# Patient Record
Sex: Female | Born: 1975
Health system: Southern US, Community
[De-identification: ages and names within clinical notes are randomized; demographics above are authoritative.]

## PROBLEM LIST (undated history)

## (undated) DIAGNOSIS — F329 Major depressive disorder, single episode, unspecified: Secondary | ICD-10-CM

## (undated) DIAGNOSIS — Z975 Presence of (intrauterine) contraceptive device: Secondary | ICD-10-CM

## (undated) DIAGNOSIS — Z808 Family history of malignant neoplasm of other organs or systems: Secondary | ICD-10-CM

## (undated) DIAGNOSIS — F32A Depression, unspecified: Secondary | ICD-10-CM

## (undated) DIAGNOSIS — B019 Varicella without complication: Secondary | ICD-10-CM

## (undated) DIAGNOSIS — S8265XA Nondisplaced fracture of lateral malleolus of left fibula, initial encounter for closed fracture: Secondary | ICD-10-CM

## (undated) DIAGNOSIS — F419 Anxiety disorder, unspecified: Secondary | ICD-10-CM

## (undated) HISTORY — DX: Depression, unspecified: F32.A

## (undated) HISTORY — DX: Nondisplaced fracture of lateral malleolus of left fibula, initial encounter for closed fracture: S82.65XA

## (undated) HISTORY — DX: Varicella without complication: B01.9

## (undated) HISTORY — DX: Presence of (intrauterine) contraceptive device: Z97.5

## (undated) HISTORY — DX: Major depressive disorder, single episode, unspecified: F32.9

## (undated) HISTORY — DX: Family history of malignant neoplasm of other organs or systems: Z80.8

---

## 2008-04-07 HISTORY — PX: AUGMENTATION MAMMAPLASTY: SUR837

## 2008-04-07 HISTORY — PX: COSMETIC SURGERY: SHX468

## 2013-03-10 DIAGNOSIS — K589 Irritable bowel syndrome without diarrhea: Secondary | ICD-10-CM | POA: Insufficient documentation

## 2017-09-17 ENCOUNTER — Ambulatory Visit: Payer: 59 | Admitting: Family Medicine

## 2017-09-17 ENCOUNTER — Other Ambulatory Visit: Payer: Self-pay

## 2017-09-17 ENCOUNTER — Encounter: Payer: Self-pay | Admitting: Family Medicine

## 2017-09-17 VITALS — BP 118/72 | HR 85 | Temp 98.0°F | Resp 16 | Ht 61.0 in | Wt 144.4 lb

## 2017-09-17 DIAGNOSIS — Z808 Family history of malignant neoplasm of other organs or systems: Secondary | ICD-10-CM | POA: Diagnosis not present

## 2017-09-17 DIAGNOSIS — Z975 Presence of (intrauterine) contraceptive device: Secondary | ICD-10-CM

## 2017-09-17 DIAGNOSIS — Z0000 Encounter for general adult medical examination without abnormal findings: Secondary | ICD-10-CM | POA: Diagnosis not present

## 2017-09-17 HISTORY — DX: Family history of malignant neoplasm of other organs or systems: Z80.8

## 2017-09-17 HISTORY — DX: Presence of (intrauterine) contraceptive device: Z97.5

## 2017-09-17 NOTE — Progress Notes (Signed)
Subjective  Chief Complaint  Patient presents with  . Establish Care    Moved here from [place] a year ago, no CPE in years  . Annual Exam    Wants a PAP next visit    HPI: Alison Tucker is a 42 y.o. female who presents to [facility] today for a Female Wellness Visit.  Very pleasant 42 year old healthy female who most recently lived in [place], originally from [place].  Parents are from [place] and lived there now.  She has 2 daughters.  Her son passed away 3 years ago from brain cancer.  See family history Wellness Visit: annual visit with health maintenance review and exam without Pap.  Last Pap reportedly normal a year ago her gynecologist   Health maintenance: Discussed breast cancer screening.  Patient defers and will rediscuss next year.  She has no health related needs today.  Lives healthy lifestyle.  No chronic health conditions.  She uses an IUD Mirena for birth control and is amenorrheic.  Assessment  1. Encounter for annual health examination   2. IUD (intrauterine device) in place - mirena 2018   3. Family history of brain cancer   4. Family history of brain stem cancer      Plan  Female Wellness Visit:  Age appropriate Health Maintenance and Prevention measures were discussed with patient. Included topics are cancer screening recommendations, ways to keep healthy (see AVS) including dietary and exercise recommendations, regular eye and dental care, use of seat belts, and avoidance of moderate alcohol use and tobacco use.   BMI: discussed patient's BMI and encouraged positive lifestyle modifications to help get to or maintain a target BMI.  HM needs and immunizations were addressed and ordered. See below for orders. See HM and immunization section for updates.  Routine labs and screening tests ordered including cmp, cbc and lipids where appropriate.  Discussed recommendations regarding Vit D and calcium supplementation (see  AVS)  Follow up: No follow-ups on file.   Orders Placed This Encounter  Procedures  . CBC with Differential/Platelet  . Comprehensive metabolic panel  . Lipid panel  . HIV antibody   No orders of the defined types were placed in this encounter.    Lifestyle: Body mass index is 27.28 kg/m. Wt Readings from Last 3 Encounters:  09/17/17 144 lb 6.4 oz (65.5 kg)   Diet: low fat Exercise: frequently, walking and weightlifting  Patient Active Problem List   Diagnosis Date Noted  . IUD (intrauterine device) in place - mirena 2018 09/17/2017  . Family history of brain cancer 09/17/2017    Son, negative genetic screening   . Family history of brain stem cancer 09/17/2017    Sister's daughter; neg genetic testing    Health Maintenance  Topic Date Due  . HIV Screening  11/08/1991  . INFLUENZA VACCINE  11/05/2017  . PAP SMEAR  04/08/2019  . TETANUS/TDAP  04/08/2023    There is no immunization history on file for this patient. We updated and reviewed the patient's past history in detail and it is documented below. Allergies: Patient has No Known Allergies. Past Medical History Patient  has a past medical history of Chicken pox, Depression, Family history of brain cancer (09/17/2017), Family history of brain stem cancer (09/17/2017), and IUD (intrauterine device) in place - mirena 2018 (09/17/2017). Past Surgical History Patient  has a past surgical history that includes Cosmetic surgery (2010). Family History: Patient family history includes Alcohol abuse in her maternal grandfather;  Arthritis in her maternal grandmother and mother; Brain cancer in her other; Brain cancer (age of onset: 51) in her son; Hyperlipidemia in her mother; Hypertension in her mother; Learning disabilities in her daughter; Mental retardation in her daughter; Seizures in her daughter; Stroke in her mother. Social History:  Patient  reports that she has quit smoking. She has never used smokeless tobacco. She  reports that she drinks alcohol. She reports that she does not use drugs.  Review of Systems: Constitutional: negative for fever or malaise Ophthalmic: negative for photophobia, double vision or loss of vision Cardiovascular: negative for chest pain, dyspnea on exertion, or new LE swelling Respiratory: negative for SOB or persistent cough Gastrointestinal: negative for abdominal pain, change in bowel habits or melena Genitourinary: negative for dysuria or gross hematuria, no abnormal uterine bleeding or disharge Musculoskeletal: negative for new gait disturbance or muscular weakness Integumentary: negative for new or persistent rashes, no breast lumps Neurological: negative for TIA or stroke symptoms Psychiatric: negative for SI or delusions Allergic/Immunologic: negative for hives  Patient Care Team    Relationship Specialty Notifications Start End  Willow Ora, MD PCP - General Family Medicine  09/17/17     Objective  Vitals: BP 118/72   Pulse 85   Temp 98 F (36.7 C) (Oral)   Resp 16   Ht 5\' 1"  (1.549 m)   Wt 144 lb 6.4 oz (65.5 kg)   SpO2 99%   BMI 27.28 kg/m  General:  Well developed, well nourished, no acute distress  Psych:  Alert and orientedx3,normal mood and affect HEENT:  Normocephalic, atraumatic, non-icteric sclera, PERRL, oropharynx is clear without mass or exudate, supple neck without adenopathy, mass or thyromegaly Cardiovascular:  Normal S1, S2, RRR without gallop, rub or murmur, nondisplaced PMI Respiratory:  Good breath sounds bilaterally, CTAB with normal respiratory effort Gastrointestinal: normal bowel sounds, soft, non-tender, no noted masses. No HSM MSK: no deformities, contusions. Joints are without erythema or swelling. Spine and CVA region are nontender Skin:  Warm, no rashes or suspicious lesions noted Neurologic:    Mental status is normal. CN 2-11 are normal. Gross motor and sensory exams are normal. Normal gait. No tremor Breast Exam: No mass,  skin retraction or nipple discharge is appreciated in either breast. No axillary adenopathy. Fibrocystic changes are not noted   Commons side effects, risks, benefits, and alternatives for medications and treatment plan prescribed today were discussed, and the patient expressed understanding of the given instructions. Patient is instructed to call or message via MyChart if he/she has any questions or concerns regarding our treatment plan. No barriers to understanding were identified. We discussed Red Flag symptoms and signs in detail. Patient expressed understanding regarding what to do in case of urgent or emergency type symptoms.   Medication list was reconciled, printed and provided to the patient in AVS. Patient instructions and summary information was reviewed with the patient as documented in the AVS. This note was prepared with assistance of Dragon voice recognition software. Occasional wrong-word or sound-a-like substitutions may have occurred due to the inherent limitations of voice recognition software

## 2017-09-17 NOTE — Patient Instructions (Signed)
Please return in 12 months for your annual complete physical; please come fasting.  Please let me know when you had your last Tdap once you find the records.   I will release your lab results to you on your MyChart account with further instructions. Please reply with any questions.    It was a pleasure meeting you today! Thank you for choosing us to meet your healthcare needs! I truly look forward to working with you. If you have any questions or concerns, please send me a message via Mychart or call the office at [phone].  Please do these things to maintain good health!   Exercise at least 30-45 minutes a day,  4-5 days a week.   Eat a low-fat diet with lots of fruits and vegetables, up to 7-9 servings per day.  Drink plenty of water daily. Try to drink 8 8oz glasses per day.  Seatbelts can save your life. Always wear your seatbelt.  Place Smoke Detectors on every level of your home and check batteries every year.  Schedule an appointment with an eye doctor for an eye exam every 1-2 years  Safe sex - use condoms to protect yourself from STDs if you could be exposed to these types of infections. Use birth control if you do not want to become pregnant and are sexually active.  Avoid heavy alcohol use. If you drink, keep it to less than 2 drinks/day and not every day.  Health Care Power of Attorney.  Choose someone you trust that could speak for you if you became unable to speak for yourself.  Depression is common in our stressful world.If you're feeling down or losing interest in things you normally enjoy, please come in for a visit.  If anyone is threatening or hurting you, please get help. Physical or Emotional Violence is never OK.

## 2017-09-18 LAB — COMPREHENSIVE METABOLIC PANEL
AG RATIO: 1.6 (calc) (ref 1.0–2.5)
ALT: 26 U/L (ref 6–29)
AST: 23 U/L (ref 10–30)
Albumin: 4.4 g/dL (ref 3.6–5.1)
Alkaline phosphatase (APISO): 48 U/L (ref 33–115)
BUN: 11 mg/dL (ref 7–25)
CHLORIDE: 105 mmol/L (ref 98–110)
CO2: 27 mmol/L (ref 20–32)
Calcium: 9.3 mg/dL (ref 8.6–10.2)
Creat: 0.6 mg/dL (ref 0.50–1.10)
GLOBULIN: 2.8 g/dL (ref 1.9–3.7)
GLUCOSE: 85 mg/dL (ref 65–99)
Potassium: 4.2 mmol/L (ref 3.5–5.3)
SODIUM: 140 mmol/L (ref 135–146)
TOTAL PROTEIN: 7.2 g/dL (ref 6.1–8.1)
Total Bilirubin: 0.4 mg/dL (ref 0.2–1.2)

## 2017-09-18 LAB — CBC WITH DIFFERENTIAL/PLATELET
BASOS PCT: 0.9 %
Basophils Absolute: 59 cells/uL (ref 0–200)
EOS PCT: 1.2 %
Eosinophils Absolute: 78 cells/uL (ref 15–500)
HCT: 36.5 % (ref 35.0–45.0)
HEMOGLOBIN: 12.7 g/dL (ref 11.7–15.5)
Lymphs Abs: 2028 cells/uL (ref 850–3900)
MCH: 32.5 pg (ref 27.0–33.0)
MCHC: 34.8 g/dL (ref 32.0–36.0)
MCV: 93.4 fL (ref 80.0–100.0)
MONOS PCT: 11 %
MPV: 10.6 fL (ref 7.5–12.5)
NEUTROS ABS: 3621 {cells}/uL (ref 1500–7800)
Neutrophils Relative %: 55.7 %
Platelets: 278 10*3/uL (ref 140–400)
RBC: 3.91 10*6/uL (ref 3.80–5.10)
RDW: 12.9 % (ref 11.0–15.0)
Total Lymphocyte: 31.2 %
WBC mixed population: 715 cells/uL (ref 200–950)
WBC: 6.5 10*3/uL (ref 3.8–10.8)

## 2017-09-18 LAB — HIV ANTIBODY (ROUTINE TESTING W REFLEX): HIV: NONREACTIVE

## 2017-09-18 LAB — LIPID PANEL
Cholesterol: 233 mg/dL — ABNORMAL HIGH (ref <200)
HDL: 49 mg/dL — ABNORMAL LOW (ref >50)
LDL Cholesterol (Calc): 164 mg/dL (calc) — ABNORMAL HIGH
NON-HDL CHOLESTEROL (CALC): 184 mg/dL — AB (ref <130)
TRIGLYCERIDES: 95 mg/dL (ref <150)
Total CHOL/HDL Ratio: 4.8 (calc) (ref <5.0)

## 2017-10-01 ENCOUNTER — Encounter: Payer: Self-pay | Admitting: Family Medicine

## 2017-10-26 ENCOUNTER — Encounter: Payer: Self-pay | Admitting: Family Medicine

## 2017-10-26 NOTE — Telephone Encounter (Signed)
Form placed on your desk.   Kathi SimpersAmy Peterman,  LPN

## 2017-11-05 ENCOUNTER — Encounter: Payer: Self-pay | Admitting: Family Medicine

## 2017-11-09 ENCOUNTER — Ambulatory Visit: Payer: 59 | Admitting: Family Medicine

## 2017-11-09 ENCOUNTER — Other Ambulatory Visit (HOSPITAL_COMMUNITY)
Admission: RE | Admit: 2017-11-09 | Discharge: 2017-11-09 | Disposition: A | Discharge status: Home / Self Care | Payer: 59 | Source: Ambulatory Visit | Attending: Family Medicine | Admitting: Family Medicine

## 2017-11-09 ENCOUNTER — Encounter: Payer: Self-pay | Admitting: Family Medicine

## 2017-11-09 ENCOUNTER — Other Ambulatory Visit: Payer: Self-pay

## 2017-11-09 VITALS — BP 124/80 | HR 91 | Temp 98.3°F | Ht 61.0 in | Wt 144.4 lb

## 2017-11-09 DIAGNOSIS — R102 Pelvic and perineal pain: Secondary | ICD-10-CM | POA: Diagnosis not present

## 2017-11-09 DIAGNOSIS — Z975 Presence of (intrauterine) contraceptive device: Secondary | ICD-10-CM

## 2017-11-09 LAB — CBC WITH DIFFERENTIAL/PLATELET
BASOS PCT: 1 % (ref 0.0–3.0)
Basophils Absolute: 0.1 10*3/uL (ref 0.0–0.1)
Eosinophils Absolute: 0.2 10*3/uL (ref 0.0–0.7)
Eosinophils Relative: 2.4 % (ref 0.0–5.0)
HCT: 38.4 % (ref 36.0–46.0)
Hemoglobin: 13.4 g/dL (ref 12.0–15.0)
LYMPHS PCT: 31.4 % (ref 12.0–46.0)
Lymphs Abs: 2 10*3/uL (ref 0.7–4.0)
MCHC: 34.8 g/dL (ref 30.0–36.0)
MCV: 95.4 fl (ref 78.0–100.0)
MONOS PCT: 11.1 % (ref 3.0–12.0)
Monocytes Absolute: 0.7 10*3/uL (ref 0.1–1.0)
NEUTROS ABS: 3.5 10*3/uL (ref 1.4–7.7)
NEUTROS PCT: 54.1 % (ref 43.0–77.0)
Platelets: 274 10*3/uL (ref 150.0–400.0)
RBC: 4.03 Mil/uL (ref 3.87–5.11)
RDW: 13.4 % (ref 11.5–15.5)
WBC: 6.5 10*3/uL (ref 4.0–10.5)

## 2017-11-09 LAB — COMPREHENSIVE METABOLIC PANEL
ALT: 22 U/L (ref 0–35)
AST: 15 U/L (ref 0–37)
Albumin: 4.7 g/dL (ref 3.5–5.2)
Alkaline Phosphatase: 44 U/L (ref 39–117)
BUN: 12 mg/dL (ref 6–23)
CHLORIDE: 103 meq/L (ref 96–112)
CO2: 27 mEq/L (ref 19–32)
Calcium: 9.8 mg/dL (ref 8.4–10.5)
Creatinine, Ser: 0.59 mg/dL (ref 0.40–1.20)
GFR: 119.38 mL/min (ref >60.00)
GLUCOSE: 85 mg/dL (ref 70–99)
POTASSIUM: 4.2 meq/L (ref 3.5–5.1)
SODIUM: 137 meq/L (ref 135–145)
TOTAL PROTEIN: 7.6 g/dL (ref 6.0–8.3)
Total Bilirubin: 0.3 mg/dL (ref 0.2–1.2)

## 2017-11-09 LAB — POCT URINALYSIS DIPSTICK
BILIRUBIN UA: NEGATIVE
Blood, UA: NEGATIVE
GLUCOSE UA: NEGATIVE
KETONES UA: NEGATIVE
LEUKOCYTES UA: NEGATIVE
Nitrite, UA: NEGATIVE
Protein, UA: NEGATIVE
SPEC GRAV UA: 1.025 (ref 1.010–1.025)
Urobilinogen, UA: 0.2 E.U./dL
pH, UA: 6 (ref 5.0–8.0)

## 2017-11-09 LAB — POCT URINE PREGNANCY: Preg Test, Ur: NEGATIVE

## 2017-11-09 MED ORDER — DICLOFENAC SODIUM 75 MG PO TBEC: 75.0000 mg | DELAYED_RELEASE_TABLET | Freq: Two times a day (BID) | ORAL | 0 refills | Status: DC

## 2017-11-09 MED ORDER — METRONIDAZOLE 500 MG PO TABS: 500.0000 mg | ORAL_TABLET | Freq: Two times a day (BID) | ORAL | 0 refills | Status: AC

## 2017-11-09 NOTE — Patient Instructions (Signed)
Please return in 1-2 weeks for recheck.  I will release your lab results to you on your MyChart account with further instructions. Please reply with any questions.   We will call you with information regarding your referral appointment. Pelvic ultrasound If you do not hear from us within the next 2 days.   If you have any questions or concerns, please don't hesitate to send me a message via MyChart or call the office at [phone]. Thank you for visiting with us today! It's our pleasure caring for you.  Take the antiinflammtory medication twice a day. Complete the antibiotic as well.  I am concerned this pain is coming from the pelvis/gyn tract rather than the GI tract.

## 2017-11-09 NOTE — Progress Notes (Signed)
Subjective  CC:  Chief Complaint  Patient presents with  . Abdominal Pain    x 1 week, on and off, not as constant as it was last weekend but still there   . Back Pain    lower back pain x 1 week     HPI: Alison Tucker is a 42 y.o. female who presents to the office today to address the problems listed above in the chief complaint.  Pt reports one week h/o constant lower abdominal/pelvic pain with intermittent sharp pain. Worse with eating. Relieved partially with alleve but pain never resolved. No h/o f/c/s, did have decreased appetite and nausea with headache but that resolved. Has mild IBS but these sxs are different and unrelieved with BMs. BMs have been normal. No constipation. Has IUD in place; married, monogamous. Denies vaginal d/c or odor. No irregular bleeding. Now pain is radiating to low back. No urinary sxs. No flank pain.   Assessment  1. Pelvic pain   2. IUD (intrauterine device) in place - mirena 2018      Plan   Pelvic pain with IUD:  Concerning for endometritis. Start flagyl, check for infection, check blood work and pelvic ultrasound. IUD strings are visualized. May need to be removed. Check for adnexal/ovarian cyst. R/o pregnancy. nsaids and f/u 1-2 weeks.   Differential also included GI sources of pain: IBS, constipation, diverticulitis etc ...  Follow up: Return in about 2 weeks (around 11/23/2017) for recheck.   Orders Placed This Encounter  Procedures  . US PELVIC COMPLETE WITH TRANSVAGINAL  . CBC with Differential/Platelet  . Comprehensive metabolic panel  . POCT urinalysis dipstick  . POCT urine pregnancy   Meds ordered this encounter  Medications  . diclofenac (VOLTAREN) 75 MG EC tablet    Sig: Take 1 tablet (75 mg total) by mouth 2 (two) times daily.    Dispense:  30 tablet    Refill:  0  . metroNIDAZOLE (FLAGYL) 500 MG tablet    Sig: Take 1 tablet (500 mg total) by mouth 2 (two) times daily for 7 days.    Dispense:  14 tablet    Refill:  0        I reviewed the patients updated PMH, FH, and SocHx.    Patient Active Problem List   Diagnosis Date Noted  . IUD (intrauterine device) in place - mirena 2018 09/17/2017  . Family history of brain cancer 09/17/2017  . Family history of brain stem cancer 09/17/2017   Current Meds  Medication Sig  . ibuprofen (ADVIL,MOTRIN) 200 MG tablet Take 200 mg by mouth as needed.  Marland Kitchen. levonorgestrel (MIRENA) 20 MCG/24HR IUD 1 each by Intrauterine route once.    Allergies: Patient has No Known Allergies. Family History: Patient family history includes Alcohol abuse in her maternal grandfather; Arthritis in her maternal grandmother and mother; Brain cancer in her other; Brain cancer (age of onset: 4715) in her son; Hyperlipidemia in her mother; Hypertension in her mother; Learning disabilities in her daughter; Mental retardation in her daughter; Seizures in her daughter; Stroke in her mother. Social History:  Patient  reports that she has quit smoking. She has never used smokeless tobacco. She reports that she drinks alcohol. She reports that she does not use drugs.  Review of Systems: Constitutional: Negative for fever malaise or anorexia Cardiovascular: negative for chest pain Respiratory: negative for SOB or persistent cough Gastrointestinal: negative for abdominal pain  Objective  Vitals: BP 124/80   Pulse 91  Temp 98.3 F (36.8 C)   Ht 5\' 1"  (1.549 m)   Wt 144 lb 6.4 oz (65.5 kg)   SpO2 98%   BMI 27.28 kg/m  General: no acute distress , A&Ox3 HEENT: PEERL, conjunctiva normal, Oropharynx moist,neck is supple Cardiovascular:  RRR without murmur or gallop.  Respiratory:  Good breath sounds bilaterally, CTAB with normal respiratory effort Gastrointestinal: soft, flat abdomen, normal active bowel sounds,  no hepatosplenomegaly, no appreciated hernias. Tender mid lower abdomen w/o rebound or guarding. No left LQ or right LQ ttp GYN: nl introitus, white d/c with odor, clear cervix with  IUD strings present, tender fundus w/o CMT or adnexal ttp Skin:  Warm, no rashes  Office Visit on 11/09/2017  Component Date Value Ref Range Status  . Color, UA 11/09/2017 yellow   Final  . Clarity, UA 11/09/2017 clear   Final  . Glucose, UA 11/09/2017 Negative  Negative Final  . Bilirubin, UA 11/09/2017 neg   Final  . Ketones, UA 11/09/2017 neg   Final  . Spec Grav, UA 11/09/2017 1.025  1.010 - 1.025 Final  . Blood, UA 11/09/2017 neg   Final  . pH, UA 11/09/2017 6.0  5.0 - 8.0 Final  . Protein, UA 11/09/2017 Negative  Negative Final  . Urobilinogen, UA 11/09/2017 0.2  0.2 or 1.0 E.U./dL Final  . Nitrite, UA 60/45/4098 neg   Final  . Leukocytes, UA 11/09/2017 Negative  Negative Final  . Odor 11/09/2017 none   Final  . Preg Test, Ur 11/09/2017 Negative  Negative Final       Commons side effects, risks, benefits, and alternatives for medications and treatment plan prescribed today were discussed, and the patient expressed understanding of the given instructions. Patient is instructed to call or message via MyChart if he/she has any questions or concerns regarding our treatment plan. No barriers to understanding were identified. We discussed Red Flag symptoms and signs in detail. Patient expressed understanding regarding what to do in case of urgent or emergency type symptoms.   Medication list was reconciled, printed and provided to the patient in AVS. Patient instructions and summary information was reviewed with the patient as documented in the AVS. This note was prepared with assistance of Dragon voice recognition software. Occasional wrong-word or sound-a-like substitutions may have occurred due to the inherent limitations of voice recognition software

## 2017-11-10 LAB — CERVICOVAGINAL ANCILLARY ONLY
Bacterial vaginitis: NEGATIVE
CANDIDA VAGINITIS: NEGATIVE
CHLAMYDIA, DNA PROBE: NEGATIVE
Neisseria Gonorrhea: NEGATIVE
TRICH (WINDOWPATH): NEGATIVE

## 2017-11-11 ENCOUNTER — Encounter: Payer: Self-pay | Admitting: Family Medicine

## 2017-11-12 ENCOUNTER — Ambulatory Visit (HOSPITAL_BASED_OUTPATIENT_CLINIC_OR_DEPARTMENT_OTHER)
Admission: RE | Admit: 2017-11-12 | Discharge: 2017-11-12 | Disposition: A | Discharge status: Home / Self Care | Payer: 59 | Source: Ambulatory Visit | Attending: Family Medicine | Admitting: Family Medicine

## 2017-11-12 ENCOUNTER — Encounter: Payer: Self-pay | Admitting: Family Medicine

## 2017-11-12 DIAGNOSIS — R102 Pelvic and perineal pain: Secondary | ICD-10-CM | POA: Diagnosis not present

## 2017-11-12 DIAGNOSIS — Z975 Presence of (intrauterine) contraceptive device: Secondary | ICD-10-CM | POA: Insufficient documentation

## 2017-11-13 ENCOUNTER — Encounter: Payer: Self-pay | Admitting: Family Medicine

## 2017-11-16 ENCOUNTER — Ambulatory Visit: Payer: 59 | Admitting: Family Medicine

## 2017-11-16 ENCOUNTER — Telehealth: Payer: Self-pay | Admitting: Emergency Medicine

## 2017-11-16 NOTE — Telephone Encounter (Signed)
Ok to waive fee 

## 2017-11-16 NOTE — Telephone Encounter (Signed)
Copied from CRM [id number]. Topic: Quick Communication - Appointment Cancellation >> Nov 16, 2017 11:06 AM Maia Pettiesrtiz, Kristie S wrote: Patient called to cancel appointment scheduled for 8/12 3:30pm Dr. Mardelle MatteAndy. Patient has not rescheduled their appointment.  pt called stating that she was seen last week and is no longer having the abdominal pains, requesting to waive fee, will RS if pain restarts  Route to department's PEC pool.

## 2017-11-17 NOTE — Telephone Encounter (Signed)
Emailed Psychologist, occupationalcharge correction regarding NS fee.

## 2018-07-22 ENCOUNTER — Encounter: Payer: Self-pay | Admitting: Family Medicine

## 2018-07-22 ENCOUNTER — Other Ambulatory Visit: Payer: Self-pay

## 2018-07-22 ENCOUNTER — Ambulatory Visit: Payer: 59 | Admitting: Family Medicine

## 2018-07-22 VITALS — BP 116/72 | HR 72 | Temp 98.3°F | Resp 18 | Ht 61.0 in | Wt 146.2 lb

## 2018-07-22 DIAGNOSIS — R109 Unspecified abdominal pain: Secondary | ICD-10-CM

## 2018-07-22 DIAGNOSIS — R1011 Right upper quadrant pain: Secondary | ICD-10-CM

## 2018-07-22 DIAGNOSIS — R071 Chest pain on breathing: Secondary | ICD-10-CM

## 2018-07-22 LAB — COMPREHENSIVE METABOLIC PANEL
ALT: 25 U/L (ref 0–35)
AST: 20 U/L (ref 0–37)
Albumin: 4.6 g/dL (ref 3.5–5.2)
Alkaline Phosphatase: 46 U/L (ref 39–117)
BUN: 9 mg/dL (ref 6–23)
CO2: 28 mEq/L (ref 19–32)
Calcium: 9.5 mg/dL (ref 8.4–10.5)
Chloride: 103 mEq/L (ref 96–112)
Creatinine, Ser: 0.61 mg/dL (ref 0.40–1.20)
GFR: 107.72 mL/min (ref >60.00)
Glucose, Bld: 85 mg/dL (ref 70–99)
Potassium: 4.3 mEq/L (ref 3.5–5.1)
Sodium: 139 mEq/L (ref 135–145)
Total Bilirubin: 0.5 mg/dL (ref 0.2–1.2)
Total Protein: 7.5 g/dL (ref 6.0–8.3)

## 2018-07-22 LAB — CBC WITH DIFFERENTIAL/PLATELET
Basophils Absolute: 0 10*3/uL (ref 0.0–0.1)
Basophils Relative: 0.5 % (ref 0.0–3.0)
Eosinophils Absolute: 0.2 10*3/uL (ref 0.0–0.7)
Eosinophils Relative: 2.1 % (ref 0.0–5.0)
HCT: 39.9 % (ref 36.0–46.0)
Hemoglobin: 13.4 g/dL (ref 12.0–15.0)
Lymphocytes Relative: 23.2 % (ref 12.0–46.0)
Lymphs Abs: 1.9 10*3/uL (ref 0.7–4.0)
MCHC: 33.7 g/dL (ref 30.0–36.0)
MCV: 98.5 fl (ref 78.0–100.0)
Monocytes Absolute: 0.8 10*3/uL (ref 0.1–1.0)
Monocytes Relative: 10.6 % (ref 3.0–12.0)
Neutro Abs: 5.1 10*3/uL (ref 1.4–7.7)
Neutrophils Relative %: 63.6 % (ref 43.0–77.0)
Platelets: 282 10*3/uL (ref 150.0–400.0)
RBC: 4.05 Mil/uL (ref 3.87–5.11)
RDW: 13.3 % (ref 11.5–15.5)
WBC: 8 10*3/uL (ref 4.0–10.5)

## 2018-07-22 LAB — POCT URINALYSIS DIPSTICK
Bilirubin, UA: NEGATIVE
Blood, UA: NEGATIVE
Glucose, UA: NEGATIVE
Ketones, UA: NEGATIVE
Leukocytes, UA: NEGATIVE
Nitrite, UA: NEGATIVE
Protein, UA: NEGATIVE
Spec Grav, UA: 1.02 (ref 1.010–1.025)
Urobilinogen, UA: 0.2 E.U./dL
pH, UA: 6 (ref 5.0–8.0)

## 2018-07-22 MED ORDER — HYDROCODONE-ACETAMINOPHEN 5-325 MG PO TABS: 1.0000 | ORAL_TABLET | Freq: Four times a day (QID) | ORAL | 0 refills | Status: DC | PRN

## 2018-07-22 MED ORDER — KETOROLAC TROMETHAMINE 60 MG/2ML IM SOLN
60.0000 mg | Freq: Once | INTRAMUSCULAR | Status: AC
  Administered 2018-07-22: 60 mg via INTRAMUSCULAR

## 2018-07-22 MED ORDER — TAMSULOSIN HCL 0.4 MG PO CAPS: 0.4000 mg | ORAL_CAPSULE | Freq: Every day | ORAL | 0 refills | Status: DC

## 2018-07-22 NOTE — Progress Notes (Signed)
Subjective  CC:  Chief Complaint  Patient presents with  . Flank Pain    Started last evening (right side). She has tried IBU with no relief. C/o not being able to get comfortable in any position. Denies urinary or bowel complications or any new activities    HPI: Alison Tucker is a 43 y.o. female who presents to the office today to address the problems listed above in the chief complaint.  43 yo healthy female with acute right flank pain: started abruptly late yesterday afternoon. Persists and is moderate to severe in intensity. Describes pain as constant and stabbing. Not relieved by ibuprofen, different positions. Couldn't sleep last night due to pain. She denies recent increase in activity or back pain or injury; No GI sxs, urinary sxs, pelvic pain or vaginal discharge. No gross hematuria. No rash. No h/o similar pain. No h/o renal stones. She denies DOE or SOB but pain does increase with deep breaths. No calf pain, swelling or recent travel.    Assessment  1. Right flank pain      Plan   Right flank pain: Differential diagnosis includes nephrolithiasis, musculoskeletal injury, shingles, GI or pulmonary problems.  Currently, symptoms are most consistent with kidney stone.  Will treat presumptively.  Start Flomax and Norco.  He had Advil as needed.  Hydrate.  See after visit summary for further education.  Monitor for rash.  Follow-up with a MyChart note tomorrow to see how she is coming along.  To emergency room for severe pain or worsening pain, or fever.  Await lab work.  Recheck early next week if not improving.  Sent home with urine strainer  Follow up: Return in about 8 weeks (around 09/16/2018) for complete physical.  Visit date not found  Orders Placed This Encounter  Procedures  . CBC with Differential/Platelet  . Comprehensive metabolic panel  . POCT urinalysis dipstick   Meds ordered this encounter  Medications  . ketorolac (TORADOL) injection 60 mg  .  HYDROcodone-acetaminophen (NORCO) 5-325 MG tablet    Sig: Take 1 tablet by mouth every 6 (six) hours as needed for moderate pain.    Dispense:  20 tablet    Refill:  0  . tamsulosin (FLOMAX) 0.4 MG CAPS capsule    Sig: Take 1 capsule (0.4 mg total) by mouth daily.    Dispense:  10 capsule    Refill:  0      I reviewed the patients updated PMH, FH, and SocHx.    Patient Active Problem List   Diagnosis Date Noted  . IUD (intrauterine device) in place - mirena 2018 09/17/2017  . Family history of brain cancer 09/17/2017  . Family history of brain stem cancer 09/17/2017   Current Meds  Medication Sig  . ibuprofen (ADVIL,MOTRIN) 200 MG tablet Take 200 mg by mouth as needed.  Marland Kitchen. levonorgestrel (MIRENA) 20 MCG/24HR IUD 1 each by Intrauterine route once.    Allergies: Patient has No Known Allergies. Family History: Patient family history includes Alcohol abuse in her maternal grandfather; Arthritis in her maternal grandmother and mother; Brain cancer in an other family member; Brain cancer (age of onset: 5315) in her son; Hyperlipidemia in her mother; Hypertension in her mother; Learning disabilities in her daughter; Mental retardation in her daughter; Seizures in her daughter; Stroke in her mother. Social History:  Patient  reports that she has quit smoking. She has never used smokeless tobacco. She reports current alcohol use. She reports that she does not use drugs.  Review of Systems: Constitutional: Negative for fever malaise or anorexia Cardiovascular: negative for chest pain Respiratory: negative for SOB or persistent cough Gastrointestinal: negative for abdominal pain  Objective  Vitals: BP 116/72   Pulse 72   Temp 98.3 F (36.8 C) (Oral)   Resp 18   Ht 5\' 1"  (1.549 m)   Wt 146 lb 3.2 oz (66.3 kg)   SpO2 98%   BMI 27.62 kg/m  General: Nontoxic appearing but appears uncomfortable, A&Ox3 HEENT: PEERL, conjunctiva normal, Oropharynx moist,neck is supple Cardiovascular:   RRR without murmur or gallop.  Respiratory:  Good breath sounds bilaterally, CTAB with normal respiratory effort, no respiratory distress, right CVA tenderness is present, none on left Gastrointestinal: soft, flat abdomen, normal active bowel sounds, no palpable masses, no hepatosplenomegaly, no appreciated hernias Skin:  Warm, no rashes Lower extremities: No calf tenderness or swelling  Toradol 60 mg IM injection given.  Mild improvement in symptoms noted by patient. Office Visit on 07/22/2018  Component Date Value Ref Range Status  . Color, UA 07/22/2018 Yellow   Final  . Clarity, UA 07/22/2018 Clear   Final  . Glucose, UA 07/22/2018 Negative  Negative Final  . Bilirubin, UA 07/22/2018 Negative   Final  . Ketones, UA 07/22/2018 Negative   Final  . Spec Grav, UA 07/22/2018 1.020  1.010 - 1.025 Final  . Blood, UA 07/22/2018 Negative   Final  . pH, UA 07/22/2018 6.0  5.0 - 8.0 Final  . Protein, UA 07/22/2018 Negative  Negative Final  . Urobilinogen, UA 07/22/2018 0.2  0.2 or 1.0 E.U./dL Final  . Nitrite, UA 42/68/3419 Negative   Final  . Leukocytes, UA 07/22/2018 Negative  Negative Final      Commons side effects, risks, benefits, and alternatives for medications and treatment plan prescribed today were discussed, and the patient expressed understanding of the given instructions. Patient is instructed to call or message via MyChart if he/she has any questions or concerns regarding our treatment plan. No barriers to understanding were identified. We discussed Red Flag symptoms and signs in detail. Patient expressed understanding regarding what to do in case of urgent or emergency type symptoms.   Medication list was reconciled, printed and provided to the patient in AVS. Patient instructions and summary information was reviewed with the patient as documented in the AVS. This note was prepared with assistance of Dragon voice recognition software. Occasional wrong-word or sound-a-like  substitutions may have occurred due to the inherent limitations of voice recognition software

## 2018-07-22 NOTE — Patient Instructions (Signed)
Please return in June 2020 for your annual complete physical; please come fasting.  Please send me a Mychart message tomorrow to let me know how you are feeling with the medications.  I will recheck you early next week via a video visit if needed.  If things worsen, go to the ER.  Watch for a rash, stay hydrated.   If you have any questions or concerns, please don't hesitate to send me a message via MyChart or call the office at [phone]. Thank you for visiting with us today! It's our pleasure caring for you.   Flank Pain, Adult Flank pain is pain that is located on the side of the body between the upper abdomen and the back. This area is called the flank. The pain may occur over a short period of time (acute), or it may be long-term or recurring (chronic). It may be mild or severe. Flank pain can be caused by many things, including:  Muscle soreness or injury.  Kidney stones or kidney disease.  Stress.  A disease of the spine (vertebral disk disease).  A lung infection (pneumonia).  Fluid around the lungs (pulmonary edema).  A skin rash caused by the chickenpox virus (shingles).  Tumors that affect the back of the abdomen.  Gallbladder disease. Follow these instructions at home:   Drink enough fluid to keep your urine clear or pale yellow.  Rest as told by your health care provider.  Take over-the-counter and prescription medicines only as told by your health care provider.  Keep a journal to track what has caused your flank pain and what has made it feel better.  Keep all follow-up visits as told by your health care provider. This is important. Contact a health care provider if:  Your pain is not controlled with medicine.  You have new symptoms.  Your pain gets worse.  You have a fever.  Your symptoms last longer than 2-3 days.  You have trouble urinating or you are urinating very frequently. Get help right away if:  You have trouble breathing or you are  short of breath.  Your abdomen hurts or it is swollen or red.  You have nausea or vomiting.  You feel faint or you pass out.  You have blood in your urine. Summary  Flank pain is pain that is located on the side of the body between the upper abdomen and the back.  The pain may occur over a short period of time (acute), or it may be long-term or recurring (chronic). It may be mild or severe.  Flank pain can be caused by many things.  Contact your health care provider if your symptoms get worse or they last longer than 2-3 days. This information is not intended to replace advice given to you by your health care provider. Make sure you discuss any questions you have with your health care provider. Document Released: 05/15/2005 Document Revised: 06/06/2016 Document Reviewed: 06/06/2016 Elsevier Interactive Patient Education  2019 [employer]   Kidney Stones  Kidney stones (urolithiasis) are solid, rock-like deposits that form inside of the organs that make urine (kidneys). A kidney stone may form in a kidney and move into the bladder, where it can cause intense pain and block the flow of urine. Kidney stones are created when high levels of certain minerals are found in the urine. They are usually passed through urination, but in some cases, medical treatment may be needed to remove them. What are the causes? Kidney stones may be  caused by:  A condition in which certain glands produce too much parathyroid hormone (primary hyperparathyroidism), which causes too much calcium buildup in the blood.  Buildup of uric acid crystals in the bladder (hyperuricosuria). Uric acid is a chemical that the body produces when you eat certain foods. It usually exits the body in the urine.  Narrowing (stricture) of one or both of the tubes that drain urine from the kidneys to the bladder (ureters).  A kidney blockage that is present at birth (congenital obstruction).  Past surgery on the kidney or  the ureters, such as gastric bypass surgery. What increases the risk? The following factors make you more likely to develop kidney stones:  Having had a kidney stone in the past.  Having a family history of kidney stones.  Not drinking enough water.  Eating a diet that is high in protein, salt (sodium), or sugar.  Being overweight or obese. What are the signs or symptoms? Symptoms of a kidney stone may include:  Nausea.  Vomiting.  Blood in the urine (hematuria).  Pain in the side of the abdomen, right below the ribs (flank pain). Pain usually spreads (radiates) to the groin.  Needing to urinate frequently or urgently. How is this diagnosed? This condition may be diagnosed based on:  Your medical history.  A physical exam.  Blood tests.  Urine tests.  CT scan.  Abdominal X-ray.  A procedure to examine the inside of the bladder (cystoscopy). How is this treated? Treatment for kidney stones depends on the size, location, and makeup of the stones. Treatment may involve:  Analyzing your urine before and after you pass the stone through urination.  Being monitored at the hospital until you pass the stone through urination.  Increasing your fluid intake and decreasing the amount of calcium and protein in your diet.  A procedure to break up kidney stones in the bladder using: ? A focused beam of light (laser therapy). ? Shock waves (extracorporeal shock wave lithotripsy).  Surgery to remove kidney stones. This may be needed if you have severe pain or have stones that block your urinary tract. Follow these instructions at home: Eating and drinking  Drink enough fluid to keep your urine clear or pale yellow. This will help you to pass the kidney stone.  If directed, change your diet. This may include: ? Limiting how much sodium you eat. ? Eating more fruits and vegetables. ? Limiting how much meat, poultry, fish, and eggs you eat.  Follow instructions from your  health care provider about eating or drinking restrictions. General instructions  Collect urine samples as told by your health care provider. You may need to collect a urine sample: ? 24 hours after you pass the stone. ? 8-12 weeks after passing the kidney stone, and every 6-12 months after that.  Strain your urine every time you urinate, for as long as directed. Use the strainer that your health care provider recommends.  Do not throw out the kidney stone after passing it. Keep the stone so it can be tested by your health care provider. Testing the makeup of your kidney stone may help prevent you from getting kidney stones in the future.  Take over-the-counter and prescription medicines only as told by your health care provider.  Keep all follow-up visits as told by your health care provider. This is important. You may need follow-up X-rays or ultrasounds to make sure that your stone has passed. How is this prevented? To prevent another kidney stone:  Drink enough fluid to keep your urine clear or pale yellow. This is the best way to prevent kidney stones.  Eat a healthy diet and follow recommendations from your health care provider about foods to avoid. You may be instructed to eat a low-protein diet. Recommendations vary depending on the type of kidney stone that you have.  Maintain a healthy weight. Contact a health care provider if:  You have pain that gets worse or does not get better with medicine. Get help right away if:  You have a fever or chills.  You develop severe pain.  You develop new abdominal pain.  You faint.  You are unable to urinate. This information is not intended to replace advice given to you by your health care provider. Make sure you discuss any questions you have with your health care provider. Document Released: 03/24/2005 Document Revised: 09/04/2016 Document Reviewed: 09/07/2015 Elsevier Interactive Patient Education  2019 [employer].

## 2018-07-23 ENCOUNTER — Encounter: Payer: Self-pay | Admitting: Family Medicine

## 2018-07-23 ENCOUNTER — Ambulatory Visit (INDEPENDENT_AMBULATORY_CARE_PROVIDER_SITE_OTHER)
Admission: RE | Admit: 2018-07-23 | Discharge: 2018-07-23 | Disposition: A | Discharge status: Home / Self Care | Payer: 59 | Source: Ambulatory Visit | Attending: Family Medicine | Admitting: Family Medicine

## 2018-07-23 DIAGNOSIS — R1011 Right upper quadrant pain: Secondary | ICD-10-CM | POA: Diagnosis not present

## 2018-07-23 DIAGNOSIS — R109 Unspecified abdominal pain: Secondary | ICD-10-CM

## 2018-07-23 MED ORDER — ONDANSETRON 4 MG PO TBDP: 4.0000 mg | ORAL_TABLET | Freq: Three times a day (TID) | ORAL | 0 refills | Status: DC | PRN

## 2018-07-23 NOTE — Telephone Encounter (Signed)
Please call patient: I have reviewed his/her lab results. Her abdominal and pelvic CT are completely normal. It is not clear what is causing her pain. At this point, I recommend home, pain medications and monitor progression of symptoms. Monitor for rash, SOB, chest pain or worsening pain. F/u virtual visit on Monday please.

## 2018-07-23 NOTE — Addendum Note (Signed)
Addended by: Asencion Partridge on: 07/23/2018 10:29 AM   Modules accepted: Orders

## 2018-07-26 ENCOUNTER — Ambulatory Visit (INDEPENDENT_AMBULATORY_CARE_PROVIDER_SITE_OTHER): Payer: 59 | Admitting: Family Medicine

## 2018-07-26 ENCOUNTER — Other Ambulatory Visit: Payer: Self-pay

## 2018-07-26 ENCOUNTER — Encounter: Payer: Self-pay | Admitting: Family Medicine

## 2018-07-26 DIAGNOSIS — R071 Chest pain on breathing: Secondary | ICD-10-CM | POA: Diagnosis not present

## 2018-07-26 DIAGNOSIS — R109 Unspecified abdominal pain: Secondary | ICD-10-CM

## 2018-07-26 DIAGNOSIS — R1011 Right upper quadrant pain: Secondary | ICD-10-CM | POA: Diagnosis not present

## 2018-07-26 NOTE — Progress Notes (Signed)
Virtual Visit via Video Note  Subjective  CC:  Chief Complaint  Patient presents with  . Flank Pain     I connected with Alison Tucker on 07/26/18 at 11:20 AM EDT by a video enabled telemedicine application and verified that I am speaking with the correct person using two identifiers. Location patient: Home Location provider: [facility], Office Persons participating in the virtual visit: Nakeitha Farney, Willow Ora, MD Rita Ohara, CMA  I discussed the limitations of evaluation and management by telemedicine and the availability of in person appointments. The patient expressed understanding and agreed to proceed. HPI: Alison Tucker is a 43 y.o. female who was contacted today to address the problems listed above in the chief complaint. . This is f/u for right flank pain: evaluated in office last week. Had ttp at right flank, benign abdomen, nl urine and cmp/cbc. Abd/pelvic CT was also normal. Required norco x 48 hours due to pain, but now doing much better. No pain meds in last 24 hours and doing well. Slept last night. Was able to get comfortable for first time. No f/c/s. Did have pain with deep inspiration but no sob. Increased pain with certain positions in bed. Used strainer for urine but did not see stone. No gross hematuria. Nl appetite. Feeling better.  Assessment  1. Right flank pain   2. RUQ abdominal pain   3. Inspiratory pain      Plan   Flank pain:  Presumed MSK or passed stone not visualized on ct scan: educated. Improved. Monitor for complete resolution. F/u if needed.  I discussed the assessment and treatment plan with the patient. The patient was provided an opportunity to ask questions and all were answered. The patient agreed with the plan and demonstrated an understanding of the instructions.   The patient was advised to call back or seek an in-person evaluation if the symptoms worsen or if the condition fails to improve as  anticipated. Follow up: Return for as scheduled in june for cpe. .  09/16/2018  No orders of the defined types were placed in this encounter.     I reviewed the patients updated PMH, FH, and SocHx.    Patient Active Problem List   Diagnosis Date Noted  . IUD (intrauterine device) in place - mirena 2018 09/17/2017  . Family history of brain cancer 09/17/2017  . Family history of brain stem cancer 09/17/2017   Current Meds  Medication Sig  . ibuprofen (ADVIL,MOTRIN) 200 MG tablet Take 200 mg by mouth as needed.  Marland Kitchen levonorgestrel (MIRENA) 20 MCG/24HR IUD 1 each by Intrauterine route once.  . ondansetron (ZOFRAN ODT) 4 MG disintegrating tablet Take 1 tablet (4 mg total) by mouth every 8 (eight) hours as needed for nausea or vomiting.  . [DISCONTINUED] HYDROcodone-acetaminophen (NORCO) 5-325 MG tablet Take 1 tablet by mouth every 6 (six) hours as needed for moderate pain.  . [DISCONTINUED] tamsulosin (FLOMAX) 0.4 MG CAPS capsule Take 1 capsule (0.4 mg total) by mouth daily.    Allergies: Patient has No Known Allergies. Family History: Patient family history includes Alcohol abuse in her maternal grandfather; Arthritis in her maternal grandmother and mother; Brain cancer in an other family member; Brain cancer (age of onset: 70) in her son; Hyperlipidemia in her mother; Hypertension in her mother; Learning disabilities in her daughter; Mental retardation in her daughter; Seizures in her daughter; Stroke in her mother. Social History:  Patient  reports that she has quit smoking. She  has never used smokeless tobacco. She reports current alcohol use. She reports that she does not use drugs.  Review of Systems: Constitutional: Negative for fever malaise or anorexia Cardiovascular: negative for chest pain Respiratory: negative for SOB or persistent cough Gastrointestinal: negative for abdominal pain  OBJECTIVE Vitals: There were no vitals taken for this visit. reports she is afebril  General: no acute distress , A&Ox3, appears well  Willow Oraamille L Andy, MD

## 2018-09-16 ENCOUNTER — Encounter: Payer: 59 | Admitting: Family Medicine

## 2018-09-20 ENCOUNTER — Ambulatory Visit (INDEPENDENT_AMBULATORY_CARE_PROVIDER_SITE_OTHER): Payer: Managed Care, Other (non HMO) | Admitting: Family Medicine

## 2018-09-20 ENCOUNTER — Other Ambulatory Visit: Payer: Self-pay

## 2018-09-20 ENCOUNTER — Encounter: Payer: Self-pay | Admitting: Family Medicine

## 2018-09-20 VITALS — BP 116/64 | HR 77 | Temp 98.5°F | Resp 14 | Wt 144.2 lb

## 2018-09-20 DIAGNOSIS — Z0000 Encounter for general adult medical examination without abnormal findings: Secondary | ICD-10-CM | POA: Diagnosis not present

## 2018-09-20 DIAGNOSIS — Z975 Presence of (intrauterine) contraceptive device: Secondary | ICD-10-CM

## 2018-09-20 NOTE — Progress Notes (Signed)
Subjective  Chief Complaint  Patient presents with  . Annual Exam    Not fasting    HPI: Alison Tucker is a 43 y.o. female who presents to [facility] today for a Female Wellness Visit.   Wellness Visit: annual visit with health maintenance review and exam without Pap   43 yo female doing very well. Healthy lifestyle with routine exercise. Mirena IUD for birth control. Has noted a little less libido. No perimenopausal sxs. Busy mother - they are also in the process of adopting. Happy. No concerns.   Discussed breast ca screening options: pt defers mammogram.   Assessment  1. Annual physical exam   2. IUD (intrauterine device) in place - mirena 2018      Plan  Female Wellness Visit:  Age appropriate Health Maintenance and Prevention measures were discussed with patient. Included topics are cancer screening recommendations, ways to keep healthy (see AVS) including dietary and exercise recommendations, regular eye and dental care, use of seat belts, and avoidance of moderate alcohol use and tobacco use.   BMI: discussed patient's BMI and encouraged positive lifestyle modifications to help get to or maintain a target BMI.  HM needs and immunizations were addressed and ordered. See below for orders. See HM and immunization section for updates.  Routine labs and screening tests ordered including cmp, cbc and lipids where appropriate.  Discussed recommendations regarding Vit D and calcium supplementation (see AVS)  Follow up: Return in about 1 year (around 09/20/2019) for complete physical.   Orders Placed This Encounter  Procedures  . CBC with Differential/Platelet  . Comprehensive metabolic panel  . Lipid panel  . HIV Antibody (routine testing w rflx)   No orders of the defined types were placed in this encounter.     Lifestyle: Body mass index is 27.25 kg/m. Wt Readings from Last 3 Encounters:  09/20/18 144 lb 3.2 oz (65.4 kg)  07/22/18  146 lb 3.2 oz (66.3 kg)  11/09/17 144 lb 6.4 oz (65.5 kg)    Patient Active Problem List   Diagnosis Date Noted  . IUD (intrauterine device) in place - mirena 2018 09/17/2017  . Family history of brain cancer 09/17/2017    Son, negative genetic screening   . Family history of brain stem cancer 09/17/2017    Sister's daughter; neg genetic testing    Health Maintenance  Topic Date Due  . INFLUENZA VACCINE  11/06/2018  . PAP SMEAR-Modifier  04/08/2019  . TETANUS/TDAP  08/19/2023  . HIV Screening  Completed   Immunization History  Administered Date(s) Administered  . Influenza, Quadrivalent, Recombinant, Inj, Pf 01/18/2018  . MMR 08/18/2013, 08/18/2013  . Tdap 08/18/2013  . Varicella 08/18/2013   We updated and reviewed the patient's past history in detail and it is documented below. Allergies: Patient has No Known Allergies. Past Medical History Patient  has a past medical history of Chicken pox, Depression, Family history of brain cancer (09/17/2017), Family history of brain stem cancer (09/17/2017), and IUD (intrauterine device) in place - mirena 2018 (09/17/2017). Past Surgical History Patient  has a past surgical history that includes Cosmetic surgery (2010). Family History: Patient family history includes Alcohol abuse in her maternal grandfather; Arthritis in her maternal grandmother and mother; Brain cancer in an other family member; Brain cancer (age of onset: 36) in her son; Hyperlipidemia in her mother; Hypertension in her mother; Learning disabilities in her daughter; Mental retardation in her daughter; Seizures in her daughter; Stroke in her  mother. Social History:  Patient  reports that she has quit smoking. She has never used smokeless tobacco. She reports current alcohol use. She reports that she does not use drugs.  Review of Systems: Constitutional: negative for fever or malaise Ophthalmic: negative for photophobia, double vision or loss of vision Cardiovascular:  negative for chest pain, dyspnea on exertion, or new LE swelling Respiratory: negative for SOB or persistent cough Gastrointestinal: negative for abdominal pain, change in bowel habits or melena Genitourinary: negative for dysuria or gross hematuria, no abnormal uterine bleeding or disharge Musculoskeletal: negative for new gait disturbance or muscular weakness Integumentary: negative for new or persistent rashes, no breast lumps Neurological: negative for TIA or stroke symptoms Psychiatric: negative for SI or delusions Allergic/Immunologic: negative for hives Patient Care Team    Relationship Specialty Notifications Start End  Leamon Arnt, MD PCP - General Family Medicine  09/17/17     Objective  Vitals: BP 116/64   Pulse 77   Temp 98.5 F (36.9 C) (Oral)   Resp 14   Wt 144 lb 3.2 oz (65.4 kg)   SpO2 97%   BMI 27.25 kg/m  General:  Well developed, well nourished, no acute distress  Psych:  Alert and orientedx3,normal mood and affect HEENT:  Normocephalic, atraumatic, non-icteric sclera, PERRL, oropharynx is clear without mass or exudate, supple neck without adenopathy, mass or thyromegaly Cardiovascular:  Normal S1, S2, RRR without gallop, rub or murmur, nondisplaced PMI Respiratory:  Good breath sounds bilaterally, CTAB with normal respiratory effort Gastrointestinal: normal bowel sounds, soft, non-tender, no noted masses. No HSM MSK: no deformities, contusions. Joints are without erythema or swelling. Spine and CVA region are nontender Skin:  Warm, no rashes or suspicious lesions noted Neurologic:    Mental status is normal. CN 2-11 are normal. Gross motor and sensory exams are normal. Normal gait. No tremor Breast Exam: No mass, skin retraction or nipple discharge is appreciated in either breast. No axillary adenopathy. Fibrocystic changes are not noted    Commons side effects, risks, benefits, and alternatives for medications and treatment plan prescribed today were  discussed, and the patient expressed understanding of the given instructions. Patient is instructed to call or message via MyChart if he/she has any questions or concerns regarding our treatment plan. No barriers to understanding were identified. We discussed Red Flag symptoms and signs in detail. Patient expressed understanding regarding what to do in case of urgent or emergency type symptoms.   Medication list was reconciled, printed and provided to the patient in AVS. Patient instructions and summary information was reviewed with the patient as documented in the AVS. This note was prepared with assistance of Dragon voice recognition software. Occasional wrong-word or sound-a-like substitutions may have occurred due to the inherent limitations of voice recognition software

## 2018-09-20 NOTE — Patient Instructions (Signed)
Please return in 12 months for your annual complete physical; please come fasting. ° °I will release your lab results to you on your MyChart account with further instructions. Please reply with any questions.  ° ° ° °If you have any questions or concerns, please don't hesitate to send me a message via MyChart or call the office at [phone]. Thank you for visiting with us today! It's our pleasure caring for you. ° ° °Preventive Care 40-64 Years, Female °Preventive care refers to lifestyle choices and visits with your health care provider that can promote health and wellness. °What does preventive care include? ° °· A yearly physical exam. This is also called an annual well check. °· Dental exams once or twice a year. °· Routine eye exams. Ask your health care provider how often you should have your eyes checked. °· Personal lifestyle choices, including: °? Daily care of your teeth and gums. °? Regular physical activity. °? Eating a healthy diet. °? Avoiding tobacco and drug use. °? Limiting alcohol use. °? Practicing safe sex. °? Taking low-dose aspirin daily starting at age 50. °? Taking vitamin and mineral supplements as recommended by your health care provider. °What happens during an annual well check? °The services and screenings done by your health care provider during your annual well check will depend on your age, overall health, lifestyle risk factors, and family history of disease. °Counseling °Your health care provider may ask you questions about your: °· Alcohol use. °· Tobacco use. °· Drug use. °· Emotional well-being. °· Home and relationship well-being. °· Sexual activity. °· Eating habits. °· Work and work environment. °· Method of birth control. °· Menstrual cycle. °· Pregnancy history. °Screening °You may have the following tests or measurements: °· Height, weight, and BMI. °· Blood pressure. °· Lipid and cholesterol levels. These may be checked every 5 years, or more frequently if you are over  50 years old. °· Skin check. °· Lung cancer screening. You may have this screening every year starting at age 55 if you have a 30-pack-year history of smoking and currently smoke or have quit within the past 15 years. °· Colorectal cancer screening. All adults should have this screening starting at age 50 and continuing until age 75. Your health care provider may recommend screening at age 45. You will have tests every 1-10 years, depending on your results and the type of screening test. People at increased risk should start screening at an earlier age. Screening tests may include: °? Guaiac-based fecal occult blood testing. °? Fecal immunochemical test (FIT). °? Stool DNA test. °? Virtual colonoscopy. °? Sigmoidoscopy. During this test, a flexible tube with a tiny camera (sigmoidoscope) is used to examine your rectum and lower colon. The sigmoidoscope is inserted through your anus into your rectum and lower colon. °? Colonoscopy. During this test, a long, thin, flexible tube with a tiny camera (colonoscope) is used to examine your entire colon and rectum. °· Hepatitis C blood test. °· Hepatitis B blood test. °· Sexually transmitted disease (STD) testing. °· Diabetes screening. This is done by checking your blood sugar (glucose) after you have not eaten for a while (fasting). You may have this done every 1-3 years. °· Mammogram. This may be done every 1-2 years. Talk to your health care provider about when you should start having regular mammograms. This may depend on whether you have a family history of breast cancer. °· BRCA-related cancer screening. This may be done if you have a family history of   breast, ovarian, tubal, or peritoneal cancers. °· Pelvic exam and Pap test. This may be done every 3 years starting at age 21. Starting at age 30, this may be done every 5 years if you have a Pap test in combination with an HPV test. °· Bone density scan. This is done to screen for osteoporosis. You may have this scan  if you are at high risk for osteoporosis. °Discuss your test results, treatment options, and if necessary, the need for more tests with your health care provider. °Vaccines °Your health care provider may recommend certain vaccines, such as: °· Influenza vaccine. This is recommended every year. °· Tetanus, diphtheria, and acellular pertussis (Tdap, Td) vaccine. You may need a Td booster every 10 years. °· Varicella vaccine. You may need this if you have not been vaccinated. °· Zoster vaccine. You may need this after age 60. °· Measles, mumps, and rubella (MMR) vaccine. You may need at least one dose of MMR if you were born in 1957 or later. You may also need a second dose. °· Pneumococcal 13-valent conjugate (PCV13) vaccine. You may need this if you have certain conditions and were not previously vaccinated. °· Pneumococcal polysaccharide (PPSV23) vaccine. You may need one or two doses if you smoke cigarettes or if you have certain conditions. °· Meningococcal vaccine. You may need this if you have certain conditions. °· Hepatitis A vaccine. You may need this if you have certain conditions or if you travel or work in places where you may be exposed to hepatitis A. °· Hepatitis B vaccine. You may need this if you have certain conditions or if you travel or work in places where you may be exposed to hepatitis B. °· Haemophilus influenzae type b (Hib) vaccine. You may need this if you have certain conditions. °Talk to your health care provider about which screenings and vaccines you need and how often you need them. °This information is not intended to replace advice given to you by your health care provider. Make sure you discuss any questions you have with your health care provider. °Document Released: 04/20/2015 Document Revised: 05/14/2017 Document Reviewed: 01/23/2015 °Elsevier Interactive Patient Education © 2019 [employer] ° ° °

## 2018-09-21 LAB — LIPID PANEL
Cholesterol: 218 mg/dL — ABNORMAL HIGH (ref 0–200)
HDL: 47.6 mg/dL (ref >39.00)
LDL Cholesterol: 136 mg/dL — ABNORMAL HIGH (ref 0–99)
NonHDL: 170.89
Total CHOL/HDL Ratio: 5
Triglycerides: 175 mg/dL — ABNORMAL HIGH (ref 0.0–149.0)
VLDL: 35 mg/dL (ref 0.0–40.0)

## 2018-09-21 LAB — CBC WITH DIFFERENTIAL/PLATELET
Basophils Absolute: 0.1 10*3/uL (ref 0.0–0.1)
Basophils Relative: 1.2 % (ref 0.0–3.0)
Eosinophils Absolute: 0.2 10*3/uL (ref 0.0–0.7)
Eosinophils Relative: 2.9 % (ref 0.0–5.0)
HCT: 39.5 % (ref 36.0–46.0)
Hemoglobin: 13.3 g/dL (ref 12.0–15.0)
Lymphocytes Relative: 29.4 % (ref 12.0–46.0)
Lymphs Abs: 1.8 10*3/uL (ref 0.7–4.0)
MCHC: 33.8 g/dL (ref 30.0–36.0)
MCV: 98.2 fl (ref 78.0–100.0)
Monocytes Absolute: 0.6 10*3/uL (ref 0.1–1.0)
Monocytes Relative: 10.3 % (ref 3.0–12.0)
Neutro Abs: 3.5 10*3/uL (ref 1.4–7.7)
Neutrophils Relative %: 56.2 % (ref 43.0–77.0)
Platelets: 268 10*3/uL (ref 150.0–400.0)
RBC: 4.02 Mil/uL (ref 3.87–5.11)
RDW: 13.2 % (ref 11.5–15.5)
WBC: 6.2 10*3/uL (ref 4.0–10.5)

## 2018-09-21 LAB — HIV ANTIBODY (ROUTINE TESTING W REFLEX): HIV 1&2 Ab, 4th Generation: NONREACTIVE

## 2018-09-21 LAB — COMPREHENSIVE METABOLIC PANEL
ALT: 20 U/L (ref 0–35)
AST: 18 U/L (ref 0–37)
Albumin: 4.2 g/dL (ref 3.5–5.2)
Alkaline Phosphatase: 48 U/L (ref 39–117)
BUN: 13 mg/dL (ref 6–23)
CO2: 26 mEq/L (ref 19–32)
Calcium: 9 mg/dL (ref 8.4–10.5)
Chloride: 102 mEq/L (ref 96–112)
Creatinine, Ser: 0.63 mg/dL (ref 0.40–1.20)
GFR: 103.7 mL/min (ref >60.00)
Glucose, Bld: 90 mg/dL (ref 70–99)
Potassium: 3.9 mEq/L (ref 3.5–5.1)
Sodium: 136 mEq/L (ref 135–145)
Total Bilirubin: 0.4 mg/dL (ref 0.2–1.2)
Total Protein: 6.8 g/dL (ref 6.0–8.3)

## 2018-12-03 ENCOUNTER — Encounter: Payer: Self-pay | Admitting: Family Medicine

## 2018-12-06 ENCOUNTER — Ambulatory Visit (INDEPENDENT_AMBULATORY_CARE_PROVIDER_SITE_OTHER): Payer: Managed Care, Other (non HMO) | Admitting: Family Medicine

## 2018-12-06 ENCOUNTER — Encounter: Payer: Self-pay | Admitting: Family Medicine

## 2018-12-06 DIAGNOSIS — F411 Generalized anxiety disorder: Secondary | ICD-10-CM

## 2018-12-06 MED ORDER — CLONAZEPAM 0.5 MG PO TABS: 0.5000 mg | ORAL_TABLET | Freq: Two times a day (BID) | ORAL | 1 refills | Status: DC | PRN

## 2018-12-06 MED ORDER — ESCITALOPRAM OXALATE 10 MG PO TABS: 10.0000 mg | ORAL_TABLET | Freq: Every day | ORAL | 2 refills | Status: DC

## 2018-12-06 NOTE — Progress Notes (Signed)
Virtual Visit via Video Note  SUBJECTIVE CC:  Chief Complaint  Patient presents with  . Anxiety    GAD 7 score today 15.. Worsening over the past 2 weeks.. In the past (several years ago) had Lexapro (daily with relief) and Ativan (sparingly)    I connected with Floella Dexheimer on 12/07/18 at  3:40 PM EDT by a video enabled telemedicine application and verified that I am speaking with the correct person using two identifiers. Location patient: Home Location provider: [facility] [place] participating in the virtual visit: Shekita Boyden, Leamon Arnt, MD Lilli Light, Hunter discussed the limitations of evaluation and management by telemedicine and the availability of in person appointments. The patient expressed understanding and agreed to proceed.  HPI: Alison Tucker is a 43 y.o. female who was contacted today to address the problems listed above in the chief complaint/mood.  2 weeks of panic/anxiety symptoms: increased stressors: foster children, covid home schooling and working from home. Finding herself highly irritable, anxious and stressed. Sleeping ok. Has been through this in the past and responded well to lexapro and ativan. Was on lexapro for about a year and then weaned off and did well until now. No depressive sxs. Life is otherwise good. Just needs help handling the stressors so that anxiety won't become worse.  Depression screen [facility] 2/9 12/06/2018 09/20/2018 09/17/2017  Decreased Interest 0 0 0  Down, Depressed, Hopeless 0 0 0  PHQ - 2 Score 0 0 0  Altered sleeping - - 0  Tired, decreased energy - - 0  Change in appetite - - 0  Feeling bad or failure about yourself  - - 0  Trouble concentrating - - 0  Moving slowly or fidgety/restless - - 0  Suicidal thoughts - - 0  PHQ-9 Score - - 0  Difficult doing work/chores - - Not difficult at all   GAD 7 : Generalized Anxiety Score 12/06/2018  Nervous, Anxious, on Edge 3  Control/stop  worrying 2  Worry too much - different things 2  Trouble relaxing 3  Restless 1  Easily annoyed or irritable 3  Afraid - awful might happen 1  Total GAD 7 Score 15  Anxiety Difficulty Somewhat difficult    ASSESSMENT 1. GAD (generalized anxiety disorder)      GAD:  Counseling and education done. Start lexapro. Klonopin as needed short term.   I discussed the assessment and treatment plan with the patient. The patient was provided an opportunity to ask questions and all were answered. The patient agreed with the plan and demonstrated an understanding of the instructions.   The patient was advised to call back or seek an in-person evaluation if the symptoms worsen or if the condition fails to improve as anticipated. Follow up: 6 weeks for mood recheck.   Visit date not found  Meds ordered this encounter  Medications  . escitalopram (LEXAPRO) 10 MG tablet    Sig: Take 1 tablet (10 mg total) by mouth daily.    Dispense:  30 tablet    Refill:  2  . clonazePAM (KLONOPIN) 0.5 MG tablet    Sig: Take 1 tablet (0.5 mg total) by mouth 2 (two) times daily as needed for anxiety.    Dispense:  20 tablet    Refill:  1      I reviewed the patients updated PMH, FH, and SocHx.    Patient Active Problem List   Diagnosis Date  Noted  . IUD (intrauterine device) in place - mirena 2018 09/17/2017  . Family history of brain cancer 09/17/2017  . Family history of brain stem cancer 09/17/2017   Current Meds  Medication Sig  . ibuprofen (ADVIL,MOTRIN) 200 MG tablet Take 200 mg by mouth as needed.  Marland Kitchen. levonorgestrel (MIRENA) 20 MCG/24HR IUD 1 each by Intrauterine route once.  . ondansetron (ZOFRAN ODT) 4 MG disintegrating tablet Take 1 tablet (4 mg total) by mouth every 8 (eight) hours as needed for nausea or vomiting.    Allergies: Patient has No Known Allergies. Family History: Patient family history includes Alcohol abuse in her maternal grandfather; Arthritis in her maternal grandmother  and mother; Brain cancer in an other family member; Brain cancer (age of onset: 4315) in her son; Hyperlipidemia in her mother; Hypertension in her mother; Learning disabilities in her daughter; Mental retardation in her daughter; Seizures in her daughter; Stroke in her mother. Social History:  Patient  reports that she has quit smoking. She has never used smokeless tobacco. She reports current alcohol use. She reports that she does not use drugs.  Review of Systems: Constitutional: Negative for fever malaise or anorexia Cardiovascular: negative for chest pain Respiratory: negative for SOB or persistent cough Gastrointestinal: negative for abdominal pain  OBJECTIVE/OBSERVATIONS: General: no acute distress, well appearing, no apparent distress, well groomed Psych:  Alert and oriented x 3,normal mood, behavior, speech, dress, and thought processes.   Willow Oraamille L Andy, MD

## 2018-12-07 ENCOUNTER — Encounter: Payer: Self-pay | Admitting: Family Medicine

## 2019-03-14 ENCOUNTER — Other Ambulatory Visit: Payer: Self-pay | Admitting: Family Medicine

## 2019-03-15 ENCOUNTER — Other Ambulatory Visit: Payer: Self-pay | Admitting: Family Medicine

## 2020-09-06 ENCOUNTER — Encounter: Payer: Self-pay | Admitting: Family Medicine

## 2020-09-07 NOTE — Telephone Encounter (Signed)
Tried calling the patient to let her know we are completely booked for today.

## 2020-09-07 NOTE — Telephone Encounter (Signed)
Please call pt and schedule with a provider that is open for today for UTI.

## 2021-01-14 ENCOUNTER — Encounter: Payer: Self-pay | Admitting: Family Medicine

## 2021-01-17 ENCOUNTER — Other Ambulatory Visit: Payer: Self-pay

## 2021-01-17 ENCOUNTER — Ambulatory Visit: Payer: Managed Care, Other (non HMO) | Admitting: Family Medicine

## 2021-01-17 ENCOUNTER — Encounter: Payer: Self-pay | Admitting: Family Medicine

## 2021-01-17 ENCOUNTER — Ambulatory Visit (INDEPENDENT_AMBULATORY_CARE_PROVIDER_SITE_OTHER)
Admission: RE | Admit: 2021-01-17 | Discharge: 2021-01-17 | Disposition: A | Discharge status: Home / Self Care | Payer: Managed Care, Other (non HMO) | Source: Ambulatory Visit | Attending: Family Medicine | Admitting: Family Medicine

## 2021-01-17 VITALS — BP 110/76 | HR 85 | Temp 98.6°F | Ht 61.0 in | Wt 145.2 lb

## 2021-01-17 DIAGNOSIS — F4322 Adjustment disorder with anxiety: Secondary | ICD-10-CM

## 2021-01-17 DIAGNOSIS — M5416 Radiculopathy, lumbar region: Secondary | ICD-10-CM

## 2021-01-17 DIAGNOSIS — M545 Low back pain, unspecified: Secondary | ICD-10-CM

## 2021-01-17 LAB — C-REACTIVE PROTEIN: CRP: <1 mg/dL (ref 0.5–20.0)

## 2021-01-17 LAB — SEDIMENTATION RATE: Sed Rate: 13 mm/hr (ref 0–20)

## 2021-01-17 MED ORDER — TRAMADOL HCL 50 MG PO TABS: 50.0000 mg | ORAL_TABLET | Freq: Three times a day (TID) | ORAL | 0 refills | Status: AC | PRN

## 2021-01-17 MED ORDER — CLONAZEPAM 0.5 MG PO TABS: 0.5000 mg | ORAL_TABLET | Freq: Every day | ORAL | 0 refills | Status: DC | PRN

## 2021-01-17 MED ORDER — PREDNISONE 10 MG PO TABS: ORAL_TABLET | ORAL | 0 refills | Status: DC

## 2021-01-17 NOTE — Progress Notes (Signed)
Subjective  CC:  Chief Complaint  Patient presents with   Back Pain    Ongoing issue, has been consistent now since 01/08/2021. Low back   Last here 2 years ago. Overdue for cpe and HM items. HPI: Alison Tucker is a 45 y.o. female who presents to the office today to address the problems listed above in the chief complaint. 45 yo with long history of chronic problems (1st reported to me today) c/o 1-2 week history persistent minimal bilateral low back pain.  Came on suddenly.  Described as pressure and pain.  She does have some radicular symptoms with paresthesias in the right heel.  No weakness or bowel or bladder dysfunction.  Pain is worse with sitting or bending.  Range of motion is significantly limited.  Over-the-counter Advil without relief.  No injury or change in activity level.  No long car rides.  Reports years ago she was evaluated for back pain and diagnosed with ankylosing spondylitis.  She does report that she had an MRI.  She never had chronic follow-up for this.  She denies a.m. stiffness however she does have chronic low back mild pain intermittently. Anxiety: Last evaluated for anxiety reaction back in 2020.  We started Lexapro and Klonopin.  She reports Lexapro was helpful but she did not take it for longer than 3 months.  Currently over the last 2 weeks with her back pain exacerbation and life stressors, she is requesting a refill of her Klonopin.  She uses it only intermittently. GAD 7 : Generalized Anxiety Score 01/17/2021 12/06/2018  Nervous, Anxious, on Edge 1 3  Control/stop worrying 2 2  Worry too much - different things 1 2  Trouble relaxing 1 3  Restless 1 1  Easily annoyed or irritable 0 3  Afraid - awful might happen 1 1  Total GAD 7 Score 7 15  Anxiety Difficulty Not difficult at all Somewhat difficult    Depression screen [facility] 2/9 01/17/2021 12/06/2018 09/20/2018 09/17/2017  Decreased Interest 0 0 0 0  Down, Depressed, Hopeless 0 0 0 0  PHQ - 2 Score 0 0 0 0   Altered sleeping - - - 0  Tired, decreased energy - - - 0  Change in appetite - - - 0  Feeling bad or failure about yourself  - - - 0  Trouble concentrating - - - 0  Moving slowly or fidgety/restless - - - 0  Suicidal thoughts - - - 0  PHQ-9 Score - - - 0  Difficult doing work/chores - - - Not difficult at all    Assessment  1. Low back pain, episodic   2. Lumbar radiculopathy   3. Adjustment disorder with anxiety      Plan  Chronic low back pain: Possibly related to ankylosing spondylitis.  She does have some radicular symptoms now.  We will treat with prednisone taper, tramadol as needed and check x-rays.  Lab work ordered.  Follow-up in 2 to 3 weeks. Anxiety, adjustment: Klonopin to be used as needed.  #20 refill today.  No indication for chronic lexical therapy at this time. Recommend follow-up with complete physical and Pap smear.  Patient will schedule  Follow up: No follow-ups on file.  Visit date not found  Orders Placed This Encounter  Procedures   DG Lumbar Spine Complete   HLA-B27 Antigen   Sedimentation rate   C-reactive protein   Meds ordered this encounter  Medications   clonazePAM (KLONOPIN) 0.5 MG tablet  Sig: Take 1 tablet (0.5 mg total) by mouth daily as needed for anxiety.    Dispense:  20 tablet    Refill:  0   predniSONE (DELTASONE) 10 MG tablet    Sig: Take 4 tabs qd x 2 days, 3 qd x 2 days, 2 qd x 2d, 1qd x 3 days    Dispense:  21 tablet    Refill:  0   traMADol (ULTRAM) 50 MG tablet    Sig: Take 1 tablet (50 mg total) by mouth every 8 (eight) hours as needed for up to 5 days.    Dispense:  15 tablet    Refill:  0      I reviewed the patients updated PMH, FH, and SocHx.    Patient Active Problem List   Diagnosis Date Noted   Adjustment disorder with anxiety 01/17/2021   Low back pain, episodic 01/17/2021   IUD (intrauterine device) in place - mirena 2018 09/17/2017   Family history of brain cancer 09/17/2017   Family history of brain  stem cancer 09/17/2017   IBS (irritable bowel syndrome) 03/10/2013   Current Meds  Medication Sig   ibuprofen (ADVIL,MOTRIN) 200 MG tablet Take 200 mg by mouth as needed.   levonorgestrel (MIRENA) 20 MCG/24HR IUD 1 each by Intrauterine route once.   predniSONE (DELTASONE) 10 MG tablet Take 4 tabs qd x 2 days, 3 qd x 2 days, 2 qd x 2d, 1qd x 3 days   traMADol (ULTRAM) 50 MG tablet Take 1 tablet (50 mg total) by mouth every 8 (eight) hours as needed for up to 5 days.   [DISCONTINUED] clonazePAM (KLONOPIN) 0.5 MG tablet Take 1 tablet (0.5 mg total) by mouth 2 (two) times daily as needed for anxiety.    Allergies: Patient has No Known Allergies. Family History: Patient family history includes Alcohol abuse in her maternal grandfather; Arthritis in her maternal grandmother and mother; Brain cancer in an other family member; Brain cancer (age of onset: 79) in her son; Hyperlipidemia in her mother; Hypertension in her mother; Learning disabilities in her daughter; Mental retardation in her daughter; Seizures in her daughter; Stroke in her mother. Social History:  Patient  reports that she has quit smoking. She has never used smokeless tobacco. She reports current alcohol use. She reports that she does not use drugs.  Review of Systems: Constitutional: Negative for fever malaise or anorexia Cardiovascular: negative for chest pain Respiratory: negative for SOB or persistent cough Gastrointestinal: negative for abdominal pain  Objective  Vitals: BP 110/76   Pulse 85   Temp 98.6 F (37 C) (Temporal)   Ht 5' 1" (1.549 m)   Wt 145 lb 3.2 oz (65.9 kg)   SpO2 97%   BMI 27.44 kg/m  General: Appears uncomfortable and seated position, A&Ox3 Back: Tender over lumbar spine and paravertebral muscles, no SI joint tenderness, mild left sciatic notch tenderness.  Negative straight leg raise bilaterally.  +2 lower extremity pulses, symmetric.  Normal gait.  Decreased forward flexion due to pain.  Pain  with extension.  Normal lateral flexion   Commons side effects, risks, benefits, and alternatives for medications and treatment plan prescribed today were discussed, and the patient expressed understanding of the given instructions. Patient is instructed to call or message via MyChart if he/she has any questions or concerns regarding our treatment plan. No barriers to understanding were identified. We discussed Red Flag symptoms and signs in detail. Patient expressed understanding regarding what to do in case of  urgent or emergency type symptoms.  Medication list was reconciled, printed and provided to the patient in AVS. Patient instructions and summary information was reviewed with the patient as documented in the AVS. This note was prepared with assistance of Dragon voice recognition software. Occasional wrong-word or sound-a-like substitutions may have occurred due to the inherent limitations of voice recognition software  This visit occurred during the SARS-CoV-2 public health emergency.  Safety protocols were in place, including screening questions prior to the visit, additional usage of staff PPE, and extensive cleaning of exam room while observing appropriate contact time as indicated for disinfecting solutions.

## 2021-01-17 NOTE — Patient Instructions (Signed)
Please schedule your annual complete physical; please come fasting. Can schedule a follow up appointment in 2 weeks to recheck your back also.   Let me know if you need more help after the medications.   If you have any questions or concerns, please don't hesitate to send me a message via MyChart or call the office at [phone]. Thank you for visiting with [place] today! It's our pleasure caring for you.   Please go to our [place] xray office to get your xrays done. You can walk in M-F between 8:30am- noon or 1pm - 5pm. Tell them you are there for xrays ordered by me. They will send me the results, then I will let you know the results with instructions.   Address: [address].  The Xray department is located in the basement.   Back Exercises The following exercises strengthen the muscles that help to support the trunk (torso) and back. They also help to keep the lower back flexible. Doing these exercises can help to prevent or lessen existing low back pain. If you have back pain or discomfort, try doing these exercises 2-3 times each day or as told by your health care provider. As your pain improves, do them once each day, but increase the number of times that you repeat the steps for each exercise (do more repetitions). To prevent the recurrence of back pain, continue to do these exercises once each day or as told by your health care provider. Do exercises exactly as told by your health care provider and adjust them as directed. It is normal to feel mild stretching, pulling, tightness, or discomfort as you do these exercises, but you should stop right away if you feel sudden pain or your pain gets worse. Exercises Single knee to chest Repeat these steps 3-5 times for each leg: Lie on your back on a firm bed or the floor with your legs extended. Bring one knee to your chest. Your other leg should stay extended and in contact with the floor. Hold your knee in place by grabbing your knee or thigh  with both hands and hold. Pull on your knee until you feel a gentle stretch in your lower back or buttocks. Hold the stretch for 10-30 seconds. Slowly release and straighten your leg. Pelvic tilt Repeat these steps 5-10 times: Lie on your back on a firm bed or the floor with your legs extended. Bend your knees so they are pointing toward the ceiling and your feet are flat on the floor. Tighten your lower abdominal muscles to press your lower back against the floor. This motion will tilt your pelvis so your tailbone points up toward the ceiling instead of pointing to your feet or the floor. With gentle tension and even breathing, hold this position for 5-10 seconds. Cat-cow Repeat these steps until your lower back becomes more flexible: Get into a hands-and-knees position on a firm bed or the floor. Keep your hands under your shoulders, and keep your knees under your hips. You may place padding under your knees for comfort. Let your head hang down toward your chest. Contract your abdominal muscles and point your tailbone toward the floor so your lower back becomes rounded like the back of a cat. Hold this position for 5 seconds. Slowly lift your head, let your abdominal muscles relax, and point your tailbone up toward the ceiling so your back forms a sagging arch like the back of a cow. Hold this position for 5 seconds.  Press-ups Repeat these steps 5-10 times: Lie on your abdomen (face-down) on a firm bed or the floor. Place your palms near your head, about shoulder-width apart. Keeping your back as relaxed as possible and keeping your hips on the floor, slowly straighten your arms to raise the top half of your body and lift your shoulders. Do not use your back muscles to raise your upper torso. You may adjust the placement of your hands to make yourself more comfortable. Hold this position for 5 seconds while you keep your back relaxed. Slowly return to lying flat on the  floor.  Bridges Repeat these steps 10 times: Lie on your back on a firm bed or the floor. Bend your knees so they are pointing toward the ceiling and your feet are flat on the floor. Your arms should be flat at your sides, next to your body. Tighten your buttocks muscles and lift your buttocks off the floor until your waist is at almost the same height as your knees. You should feel the muscles working in your buttocks and the back of your thighs. If you do not feel these muscles, slide your feet 1-2 inches (2.5-5 cm) farther away from your buttocks. Hold this position for 3-5 seconds. Slowly lower your hips to the starting position, and allow your buttocks muscles to relax completely. If this exercise is too easy, try doing it with your arms crossed over your chest. Abdominal crunches Repeat these steps 5-10 times: Lie on your back on a firm bed or the floor with your legs extended. Bend your knees so they are pointing toward the ceiling and your feet are flat on the floor. Cross your arms over your chest. Tip your chin slightly toward your chest without bending your neck. Tighten your abdominal muscles and slowly raise your torso high enough to lift your shoulder blades a tiny bit off the floor. Avoid raising your torso higher than that because it can put too much stress on your lower back and does not help to strengthen your abdominal muscles. Slowly return to your starting position. Back lifts Repeat these steps 5-10 times: Lie on your abdomen (face-down) with your arms at your sides, and rest your forehead on the floor. Tighten the muscles in your legs and your buttocks. Slowly lift your chest off the floor while you keep your hips pressed to the floor. Keep the back of your head in line with the curve in your back. Your eyes should be looking at the floor. Hold this position for 3-5 seconds. Slowly return to your starting position. Contact a health care provider if: Your back pain or  discomfort gets much worse when you do an exercise. Your worsening back pain or discomfort does not lessen within 2 hours after you exercise. If you have any of these problems, stop doing these exercises right away. Do not do them again unless your health care provider says that you can. Get help right away if: You develop sudden, severe back pain. If this happens, stop doing the exercises right away. Do not do them again unless your health care provider says that you can. This information is not intended to replace advice given to you by your health care provider. Make sure you discuss any questions you have with your health care provider. Document Revised: 06/06/2020 Document Reviewed: 06/06/2020 Elsevier Patient Education  2022 [employer].

## 2021-01-18 ENCOUNTER — Encounter: Payer: Self-pay | Admitting: Family Medicine

## 2021-01-18 LAB — HLA-B27 ANTIGEN: HLA-B27 Antigen: NEGATIVE

## 2021-01-23 MED ORDER — GABAPENTIN 300 MG PO CAPS: 300.0000 mg | ORAL_CAPSULE | Freq: Two times a day (BID) | ORAL | 0 refills | Status: DC

## 2021-01-27 ENCOUNTER — Other Ambulatory Visit: Payer: Self-pay

## 2021-01-27 ENCOUNTER — Encounter (HOSPITAL_BASED_OUTPATIENT_CLINIC_OR_DEPARTMENT_OTHER): Payer: Self-pay | Admitting: Obstetrics and Gynecology

## 2021-01-27 ENCOUNTER — Emergency Department (HOSPITAL_BASED_OUTPATIENT_CLINIC_OR_DEPARTMENT_OTHER)
Admission: EM | Admit: 2021-01-27 | Discharge: 2021-01-27 | Disposition: A | Discharge status: Home / Self Care | Payer: Managed Care, Other (non HMO) | Attending: Emergency Medicine | Admitting: Emergency Medicine

## 2021-01-27 ENCOUNTER — Emergency Department (HOSPITAL_BASED_OUTPATIENT_CLINIC_OR_DEPARTMENT_OTHER): Payer: Managed Care, Other (non HMO)

## 2021-01-27 DIAGNOSIS — Z87891 Personal history of nicotine dependence: Secondary | ICD-10-CM | POA: Diagnosis not present

## 2021-01-27 DIAGNOSIS — S8265XA Nondisplaced fracture of lateral malleolus of left fibula, initial encounter for closed fracture: Secondary | ICD-10-CM | POA: Diagnosis not present

## 2021-01-27 DIAGNOSIS — X501XXA Overexertion from prolonged static or awkward postures, initial encounter: Secondary | ICD-10-CM | POA: Insufficient documentation

## 2021-01-27 DIAGNOSIS — W19XXXA Unspecified fall, initial encounter: Secondary | ICD-10-CM

## 2021-01-27 DIAGNOSIS — S99912A Unspecified injury of left ankle, initial encounter: Secondary | ICD-10-CM | POA: Diagnosis present

## 2021-01-27 DIAGNOSIS — M25572 Pain in left ankle and joints of left foot: Secondary | ICD-10-CM | POA: Diagnosis not present

## 2021-01-27 DIAGNOSIS — S8262XA Displaced fracture of lateral malleolus of left fibula, initial encounter for closed fracture: Secondary | ICD-10-CM

## 2021-01-27 MED ORDER — OXYCODONE HCL 5 MG PO TABS: 5.0000 mg | ORAL_TABLET | Freq: Four times a day (QID) | ORAL | 0 refills | Status: DC | PRN

## 2021-01-27 MED ORDER — OXYCODONE HCL 5 MG PO TABS
5.0000 mg | ORAL_TABLET | Freq: Once | ORAL | Status: AC
  Administered 2021-01-27: 5 mg via ORAL
  Filled 2021-01-27: qty 1

## 2021-01-27 NOTE — Discharge Instructions (Addendum)
Follow-up with orthopedics.  Recommend 1000 mg of Tylenol every 6 hours as needed for pain.  Recommend 800 mg of ibuprofen every 8 hours as needed for pain.  Take Roxicodone for breakthrough pain.  This is a narcotic pain medicine.  Do not mix with alcohol or other drugs.  Do not drive or do any dangerous activities while taking this medicine.  Please keep your splint dry.  Recommend no weightbearing until you follow-up with orthopedics.

## 2021-01-27 NOTE — ED Triage Notes (Addendum)
Patient reports to the ER for left ankle injury from stepping off a curb. Patient reports she heard a pop and went down hard

## 2021-01-27 NOTE — ED Provider Notes (Signed)
[facility] EMERGENCY DEPT Provider Note   CSN: [id number] Arrival date & time: 01/27/21  1355     History Chief Complaint  Patient presents with   Ankle Pain    Alison Tucker is a 45 y.o. female.  The history is provided by the patient.  Ankle Pain Location:  Ankle Time since incident:  1 hour Injury: yes   Ankle location:  L ankle Pain details:    Quality:  Aching   Radiates to:  Does not radiate   Severity:  Mild   Onset quality:  Gradual   Timing:  Constant   Progression:  Unchanged Chronicity:  New Relieved by:  Nothing Exacerbated by: movement. Associated symptoms: swelling   Associated symptoms: no back pain, no decreased ROM, no fever, no muscle weakness, no stiffness and no tingling       Past Medical History:  Diagnosis Date   Chicken pox    Depression    Family history of brain cancer 09/17/2017   Son, negative genetic screening   Family history of brain stem cancer 09/17/2017   Sister's daughter; neg genetic testing   IUD (intrauterine device) in place - mirena 2018 09/17/2017    Patient Active Problem List   Diagnosis Date Noted   Adjustment disorder with anxiety 01/17/2021   Low back pain, episodic 01/17/2021   IUD (intrauterine device) in place - mirena 2018 09/17/2017   Family history of brain cancer 09/17/2017   Family history of brain stem cancer 09/17/2017   IBS (irritable bowel syndrome) 03/10/2013    Past Surgical History:  Procedure Laterality Date   COSMETIC SURGERY  2010     OB History     Gravida      Para      Term      Preterm      AB      Living  3      SAB      IAB      Ectopic      Multiple      Live Births              Family History  Problem Relation Age of Onset   Arthritis Mother    Hyperlipidemia Mother    Hypertension Mother    Stroke Mother    Brain cancer Son 15       astrocytoma and glioblastoma   Arthritis Maternal Grandmother    Alcohol abuse Maternal  Grandfather    Learning disabilities Daughter    Mental retardation Daughter    Seizures Daughter    Brain cancer Other        DIPG    Social History   Tobacco Use   Smoking status: Former    Passive exposure: Never   Smokeless tobacco: Never  Vaping Use   Vaping Use: Some days   Substances: Nicotine, Flavoring  Substance Use Topics   Alcohol use: Yes   Drug use: Never    Home Medications Prior to Admission medications   Medication Sig Start Date End Date Taking? Authorizing Provider  gabapentin (NEURONTIN) 300 MG capsule Take 1 capsule (300 mg total) by mouth in the morning and at bedtime. 01/23/21  Yes Willow Ora, MD  oxyCODONE (ROXICODONE) 5 MG immediate release tablet Take 1 tablet (5 mg total) by mouth every 6 (six) hours as needed for up to 10 doses for breakthrough pain. 01/27/21  Yes Samanvitha Germany, DO  clonazePAM (KLONOPIN) 0.5 MG tablet Take 1 tablet (0.5  mg total) by mouth daily as needed for anxiety. 01/17/21   Willow Ora, MD  ibuprofen (ADVIL,MOTRIN) 200 MG tablet Take 200 mg by mouth as needed.    [provider]  levonorgestrel (MIRENA) 20 MCG/24HR IUD 1 each by Intrauterine route once.    [provider]  predniSONE (DELTASONE) 10 MG tablet Take 4 tabs qd x 2 days, 3 qd x 2 days, 2 qd x 2d, 1qd x 3 days 01/17/21   Willow Ora, MD    Allergies    Patient has no known allergies.  Review of Systems   Review of Systems  Constitutional:  Negative for fever.  Musculoskeletal:  Positive for arthralgias, gait problem and joint swelling. Negative for back pain and stiffness.  Skin:  Negative for color change and wound.  Neurological:  Negative for weakness and numbness.   Physical Exam Updated Vital Signs BP 140/84 (BP Location: Right Arm)   Pulse 96   Temp 98.3 F (36.8 C) (Oral)   Resp 17   SpO2 98%   Physical Exam Constitutional:      General: She is not in acute distress.    Appearance: She is not ill-appearing.   Cardiovascular:     Pulses: Normal pulses.  Musculoskeletal:        General: Swelling and tenderness present. Normal range of motion.     Comments: Significant swelling and tenderness to the lateral malleoli of the left ankle  Skin:    General: Skin is warm.     Capillary Refill: Capillary refill takes less than 2 seconds.  Neurological:     General: No focal deficit present.     Mental Status: She is alert.     Sensory: No sensory deficit.     Motor: No weakness.    ED Results / Procedures / Treatments   Labs (all labs ordered are listed, but only abnormal results are displayed) Labs Reviewed - No data to display  EKG None  Radiology DG Ankle Complete Left  Result Date: 01/27/2021 CLINICAL DATA:  Pain.  Stepped off curb.  Injury. EXAM: LEFT ANKLE COMPLETE - 3+ VIEW COMPARISON:  None. FINDINGS: There is a transverse fracture of the LATERAL malleolus, associated with soft tissue swelling. MEDIAL malleolus and mortise are intact. IMPRESSION: LATERAL malleolar fracture and soft tissue swelling. Electronically Signed   By: Norva Pavlov M.D.   On: 01/27/2021 14:44    Procedures Procedures   Medications Ordered in ED Medications  oxyCODONE (Oxy IR/ROXICODONE) immediate release tablet 5 mg (5 mg Oral Given 01/27/21 1426)    ED Course  I have reviewed the triage vital signs and the nursing notes.  Pertinent labs & imaging results that were available during my care of the patient were reviewed by me and considered in my medical decision making (see chart for details).    MDM Rules/Calculators/A&P                           [name] is here after injuring left ankle.  Significant swelling and pain to the lateral malleoli of the left ankle.  Neurovascularly intact.  Has good range of motion but significant tenderness and swelling.  X-ray confirmed lateral malleolus fracture.  This appears to be a Weber A fracture however given significant pain and swelling not sure she  would do well with a walking boot.  At this time we will immobilize with a short ankle splint and have her  follow-up with orthopedics.  Recommend Tylenol, ibuprofen, ice.  Written for oxycodone for breakthrough pain.  Discharged in good condition.  This chart was dictated using voice recognition software.  Despite best efforts to proofread,  errors can occur which can change the documentation meaning.   Final Clinical Impression(s) / ED Diagnoses Final diagnoses:  Fall  Closed displaced fracture of lateral malleolus of left fibula, initial encounter  Closed nondisplaced fracture of lateral malleolus of left fibula, initial encounter    Rx / DC Orders ED Discharge Orders          Ordered    oxyCODONE (ROXICODONE) 5 MG immediate release tablet  Every 6 hours PRN        01/27/21 1430             Oneta Sigman, DO 01/27/21 1451

## 2021-01-28 ENCOUNTER — Encounter: Payer: Self-pay | Admitting: Orthopaedic Surgery

## 2021-01-28 ENCOUNTER — Ambulatory Visit: Payer: Managed Care, Other (non HMO) | Admitting: Orthopaedic Surgery

## 2021-01-28 DIAGNOSIS — S8265XA Nondisplaced fracture of lateral malleolus of left fibula, initial encounter for closed fracture: Secondary | ICD-10-CM | POA: Diagnosis not present

## 2021-01-28 NOTE — Progress Notes (Signed)
Office Visit Note   Patient: Alison Tucker           Date of Birth: Oct 04, 1975           MRN: [id number] Visit Date: 01/28/2021              Requested by: Willow Ora, MD [address] [place],  [place] [address] PCP: Willow Ora, MD   Assessment & Plan: Visit Diagnoses:  1. Closed nondisplaced fracture of lateral malleolus of left fibula, initial encounter     Plan: These are usually stable ankle fracture pattern syncope treated with a cam walking boot and progressive weightbearing as tolerated.  The patient understands that if it is hurting she can back off.  Ice and elevation will be helpful.  I will give her a note to allow her to return to work tomorrow but hopefully they will accommodate her for allowing her to be off of the ankle with elevation as needed for swelling.  All questions and concerns were answered and addressed.  I would like to see her back in 2 weeks with a repeat 3 views of the left ankle.  Follow-Up Instructions: Return in about 2 weeks (around 02/11/2021).   Orders:  No orders of the defined types were placed in this encounter.  No orders of the defined types were placed in this encounter.     Procedures: No procedures performed   Clinical Data: No additional findings.   Subjective: Chief Complaint  Patient presents with   Left Leg - Injury  The patient is a very pleasant and active 45 year old female who had an accidental twisting injury and fall yesterday injuring her left ankle.  She was seen in the emergency room setting and found to have a minimally displaced Weber a lateral malleolus fracture of the left ankle.  She is placed in a splint and given crutches and following up in our office today.  She has not injured this ankle before.  She does report significant pain.  She does not pick up any pain medications as of yet.  She is not a diabetic and not a smoker.  HPI  Review of Systems There is currently listed no headache, chest  pain, shortness of breath, fever, chills, nausea, vomiting  Objective: Vital Signs: There were no vitals taken for this visit.  Physical Exam She is alert and orient x3 and in no acute distress Ortho Exam Examination of her left ankle shows significant swelling and bruising laterally.  There is no swelling medially but there is pain over the deltoid ligament.  There is also pain over the lateral malleolus.  The foot is well-perfused with normal sensation.  Her knee shows no effusion on that left side. Specialty Comments:  No specialty comments available.  Imaging: DG Ankle Complete Left  Result Date: 01/27/2021 CLINICAL DATA:  Pain.  Stepped off curb.  Injury. EXAM: LEFT ANKLE COMPLETE - 3+ VIEW COMPARISON:  None. FINDINGS: There is a transverse fracture of the LATERAL malleolus, associated with soft tissue swelling. MEDIAL malleolus and mortise are intact. IMPRESSION: LATERAL malleolar fracture and soft tissue swelling. Electronically Signed   By: Norva Pavlov M.D.   On: 01/27/2021 14:44   DG Foot Complete Left  Result Date: 01/27/2021 CLINICAL DATA:  Ankle injury after stepping off a curb. EXAM: LEFT FOOT - COMPLETE 3+ VIEW COMPARISON:  None. FINDINGS: There is a mildly displaced lateral malleolus fracture. There is no joint dislocation. There is no evidence of arthropathy  or other focal bone abnormality. There is soft tissue swelling surrounding the ankle. IMPRESSION: Mildly displaced lateral malleolus fracture. Electronically Signed   By: Romona Curls M.D.   On: 01/27/2021 14:58     X-rays independently reviewed the left ankle show a Weber a lateral malleolus fracture with minimal displacement.  The ankle mortise is congruent.  PMFS History: Patient Active Problem List   Diagnosis Date Noted   Adjustment disorder with anxiety 01/17/2021   Low back pain, episodic 01/17/2021   IUD (intrauterine device) in place - mirena 2018 09/17/2017   Family history of brain cancer  09/17/2017   Family history of brain stem cancer 09/17/2017   IBS (irritable bowel syndrome) 03/10/2013   Past Medical History:  Diagnosis Date   Chicken pox    Depression    Family history of brain cancer 09/17/2017   Son, negative genetic screening   Family history of brain stem cancer 09/17/2017   Sister's daughter; neg genetic testing   IUD (intrauterine device) in place - mirena 2018 09/17/2017    Family History  Problem Relation Age of Onset   Arthritis Mother    Hyperlipidemia Mother    Hypertension Mother    Stroke Mother    Brain cancer Son 15       astrocytoma and glioblastoma   Arthritis Maternal Grandmother    Alcohol abuse Maternal Grandfather    Learning disabilities Daughter    Mental retardation Daughter    Seizures Daughter    Brain cancer Other        DIPG    Past Surgical History:  Procedure Laterality Date   COSMETIC SURGERY  2010   Social History   Occupational History   Not on file  Tobacco Use   Smoking status: Former    Passive exposure: Never   Smokeless tobacco: Never  Vaping Use   Vaping Use: Some days   Substances: Nicotine, Flavoring  Substance and Sexual Activity   Alcohol use: Yes   Drug use: Never   Sexual activity: Yes

## 2021-02-04 ENCOUNTER — Encounter: Payer: Self-pay | Admitting: Family Medicine

## 2021-02-08 ENCOUNTER — Ambulatory Visit: Payer: Managed Care, Other (non HMO) | Admitting: Family Medicine

## 2021-02-11 ENCOUNTER — Other Ambulatory Visit: Payer: Self-pay

## 2021-02-11 ENCOUNTER — Emergency Department (HOSPITAL_BASED_OUTPATIENT_CLINIC_OR_DEPARTMENT_OTHER): Payer: Managed Care, Other (non HMO)

## 2021-02-11 ENCOUNTER — Encounter (HOSPITAL_BASED_OUTPATIENT_CLINIC_OR_DEPARTMENT_OTHER): Payer: Self-pay

## 2021-02-11 ENCOUNTER — Emergency Department (HOSPITAL_BASED_OUTPATIENT_CLINIC_OR_DEPARTMENT_OTHER)
Admission: EM | Admit: 2021-02-11 | Discharge: 2021-02-11 | Disposition: A | Discharge status: Home / Self Care | Payer: Managed Care, Other (non HMO) | Attending: Emergency Medicine | Admitting: Emergency Medicine

## 2021-02-11 DIAGNOSIS — Z87891 Personal history of nicotine dependence: Secondary | ICD-10-CM | POA: Insufficient documentation

## 2021-02-11 DIAGNOSIS — R1030 Lower abdominal pain, unspecified: Secondary | ICD-10-CM | POA: Insufficient documentation

## 2021-02-11 LAB — COMPREHENSIVE METABOLIC PANEL
ALT: 25 U/L (ref 0–44)
AST: 20 U/L (ref 15–41)
Albumin: 4.6 g/dL (ref 3.5–5.0)
Alkaline Phosphatase: 50 U/L (ref 38–126)
Anion gap: 11 (ref 5–15)
BUN: 10 mg/dL (ref 6–20)
CO2: 24 mmol/L (ref 22–32)
Calcium: 9.8 mg/dL (ref 8.9–10.3)
Chloride: 101 mmol/L (ref 98–111)
Creatinine, Ser: 0.6 mg/dL (ref 0.44–1.00)
GFR, Estimated: >60 mL/min (ref >60)
Glucose, Bld: 93 mg/dL (ref 70–99)
Potassium: 3.8 mmol/L (ref 3.5–5.1)
Sodium: 136 mmol/L (ref 135–145)
Total Bilirubin: 0.5 mg/dL (ref 0.3–1.2)
Total Protein: 8.1 g/dL (ref 6.5–8.1)

## 2021-02-11 LAB — PREGNANCY, URINE: Preg Test, Ur: NEGATIVE

## 2021-02-11 LAB — CBC
HCT: 41 % (ref 36.0–46.0)
Hemoglobin: 13.7 g/dL (ref 12.0–15.0)
MCH: 32.1 pg (ref 26.0–34.0)
MCHC: 33.4 g/dL (ref 30.0–36.0)
MCV: 96 fL (ref 80.0–100.0)
Platelets: 323 10*3/uL (ref 150–400)
RBC: 4.27 MIL/uL (ref 3.87–5.11)
RDW: 13.1 % (ref 11.5–15.5)
WBC: 7 10*3/uL (ref 4.0–10.5)
nRBC: 0 % (ref 0.0–0.2)

## 2021-02-11 LAB — URINALYSIS, ROUTINE W REFLEX MICROSCOPIC
Bilirubin Urine: NEGATIVE
Glucose, UA: NEGATIVE mg/dL
Hgb urine dipstick: NEGATIVE
Ketones, ur: 15 mg/dL — AB
Leukocytes,Ua: NEGATIVE
Nitrite: NEGATIVE
Specific Gravity, Urine: 1.019 (ref 1.005–1.030)
pH: 5.5 (ref 5.0–8.0)

## 2021-02-11 LAB — LIPASE, BLOOD: Lipase: 21 U/L (ref 11–51)

## 2021-02-11 MED ORDER — IOHEXOL 300 MG/ML  SOLN
100.0000 mL | Freq: Once | INTRAMUSCULAR | Status: AC | PRN
  Administered 2021-02-11: 80 mL via INTRAVENOUS

## 2021-02-11 MED ORDER — MORPHINE SULFATE (PF) 4 MG/ML IV SOLN
4.0000 mg | Freq: Once | INTRAVENOUS | Status: AC
  Administered 2021-02-11: 4 mg via INTRAVENOUS
  Filled 2021-02-11: qty 1

## 2021-02-11 MED ORDER — FAMOTIDINE IN NACL 20-0.9 MG/50ML-% IV SOLN
20.0000 mg | Freq: Once | INTRAVENOUS | Status: AC
  Administered 2021-02-11: 20 mg via INTRAVENOUS
  Filled 2021-02-11: qty 50

## 2021-02-11 MED ORDER — SUCRALFATE 1 G PO TABS: 1.0000 g | ORAL_TABLET | Freq: Three times a day (TID) | ORAL | 0 refills | Status: DC

## 2021-02-11 MED ORDER — DICYCLOMINE HCL 20 MG PO TABS
20.0000 mg | ORAL_TABLET | Freq: Two times a day (BID) | ORAL | 0 refills | Status: DC
Start: 2021-02-11 — End: 2021-07-26

## 2021-02-11 MED ORDER — HYDROCODONE-ACETAMINOPHEN 5-325 MG PO TABS: 2.0000 | ORAL_TABLET | ORAL | 0 refills | Status: DC | PRN

## 2021-02-11 MED ORDER — SODIUM CHLORIDE 0.9 % IV BOLUS
1000.0000 mL | Freq: Once | INTRAVENOUS | Status: AC
  Administered 2021-02-11: 1000 mL via INTRAVENOUS

## 2021-02-11 MED ORDER — ONDANSETRON HCL 4 MG/2ML IJ SOLN
4.0000 mg | Freq: Once | INTRAMUSCULAR | Status: AC
  Administered 2021-02-11: 4 mg via INTRAVENOUS
  Filled 2021-02-11: qty 2

## 2021-02-11 NOTE — Discharge Instructions (Addendum)
The etiology of your abdominal pain is unclear.    There are many causes of abdominal pain. Most pain is not serious and goes away, but some pain gets worse, changes, or will not go away. Please return to the emergency department or see your doctor right away if you (or your family member) experience any of the following:  1. Pain that gets worse or moves to just one spot.  2. Pain that gets worse if you cough or sneeze.  3. Pain with going over a bump in the road.  4. Pain that does not get better in 24 hours.  5. Inability to keep down liquids (vomiting)-especially if you are making less urine.  6. Fainting.  7. Blood in the vomit or stool.  8. High fever or shaking chills.  9. Swelling of the abdomen.  10. Any new or worsening problem.      Follow-up Instructions  Return to the emergency department in 8-12 hours for recheck if worse.  See your primary care provider if not completely better in the next 2-3 days. Come to the ED if you are unable to see them in this time frame.    Additional Instructions  No alcohol.  No caffeine, aspirin, or cigarettes.   Please return to the emergency department immediately for any new or concerning symptoms, or if you get worse.   

## 2021-02-11 NOTE — ED Provider Notes (Signed)
[facility] EMERGENCY DEPT Provider Note   CSN: [id number] Arrival date & time: 02/11/21  1319     History Chief Complaint  Patient presents with   Abdominal Pain    Alison Tucker is a 45 y.o. female.  This is a 45 y.o. female with significant medical history as below, including depression who presents to the ED with complaint of abdominal pain   Location: Right lower quadrant Duration: 12 hours Onset: Sudden onset at rest Timing: Constant, progressive Description: Constant, progressively worsening, intermittent increases the pain that is sharp and stabbing. Severity: Mild to moderate Exacerbating/Alleviating Factors: Worse with palpation Associated Symptoms: Mild nausea with emesis Pertinent Negatives: No fevers, chills, chest pain, dyspnea, change in bowel or bladder function, rashes, concern for STI, abnormal vaginal bleeding or discharge.  No prior abdominal surgeries.  Last oral intake was around 11 30a -12 p.m.     The history is provided by the patient. No language interpreter was used.  Abdominal Pain Pain location:  RLQ Associated symptoms: nausea   Associated symptoms: no chest pain, no chills, no cough, no fever, no hematuria, no shortness of breath and no vomiting       Past Medical History:  Diagnosis Date   Chicken pox    Depression    Family history of brain cancer 09/17/2017   Son, negative genetic screening   Family history of brain stem cancer 09/17/2017   Sister's daughter; neg genetic testing   IUD (intrauterine device) in place - mirena 2018 09/17/2017    Patient Active Problem List   Diagnosis Date Noted   Adjustment disorder with anxiety 01/17/2021   Low back pain, episodic 01/17/2021   IUD (intrauterine device) in place - mirena 2018 09/17/2017   Family history of brain cancer 09/17/2017   Family history of brain stem cancer 09/17/2017   IBS (irritable bowel syndrome) 03/10/2013    Past Surgical History:  Procedure  Laterality Date   COSMETIC SURGERY  2010     OB History     Gravida      Para      Term      Preterm      AB      Living  3      SAB      IAB      Ectopic      Multiple      Live Births              Family History  Problem Relation Age of Onset   Arthritis Mother    Hyperlipidemia Mother    Hypertension Mother    Stroke Mother    Brain cancer Son 15       astrocytoma and glioblastoma   Arthritis Maternal Grandmother    Alcohol abuse Maternal Grandfather    Learning disabilities Daughter    Mental retardation Daughter    Seizures Daughter    Brain cancer Other        DIPG    Social History   Tobacco Use   Smoking status: Former    Passive exposure: Never   Smokeless tobacco: Never  Vaping Use   Vaping Use: Some days   Substances: Nicotine, Flavoring  Substance Use Topics   Alcohol use: Yes   Drug use: Never    Home Medications Prior to Admission medications   Medication Sig Start Date End Date Taking? Authorizing Provider  dicyclomine (BENTYL) 20 MG tablet Take 1 tablet (20 mg total) by mouth 2 (two)  times daily. 02/11/21  Yes Jeanell Sparrow, DO  HYDROcodone-acetaminophen (NORCO/VICODIN) 5-325 MG tablet Take 2 tablets by mouth every 4 (four) hours as needed. 02/11/21  Yes Wynona Dove A, DO  sucralfate (CARAFATE) 1 g tablet Take 1 tablet (1 g total) by mouth 4 (four) times daily -  with meals and at bedtime for 7 days. 02/11/21 02/18/21 Yes Jeanell Sparrow, DO  clonazePAM (KLONOPIN) 0.5 MG tablet Take 1 tablet (0.5 mg total) by mouth daily as needed for anxiety. 01/17/21   Leamon Arnt, MD  gabapentin (NEURONTIN) 300 MG capsule Take 1 capsule (300 mg total) by mouth in the morning and at bedtime. 01/23/21   Leamon Arnt, MD  ibuprofen (ADVIL,MOTRIN) 200 MG tablet Take 200 mg by mouth as needed.    [provider]  levonorgestrel (MIRENA) 20 MCG/24HR IUD 1 each by Intrauterine route once.    [provider]  oxyCODONE  (ROXICODONE) 5 MG immediate release tablet Take 1 tablet (5 mg total) by mouth every 6 (six) hours as needed for up to 10 doses for breakthrough pain. 01/27/21   Curatolo, Adam, DO  predniSONE (DELTASONE) 10 MG tablet Take 4 tabs qd x 2 days, 3 qd x 2 days, 2 qd x 2d, 1qd x 3 days 01/17/21   Leamon Arnt, MD    Allergies    Patient has no known allergies.  Review of Systems   Review of Systems  Constitutional:  Negative for chills and fever.  HENT:  Negative for facial swelling and trouble swallowing.   Eyes:  Negative for photophobia and visual disturbance.  Respiratory:  Negative for cough and shortness of breath.   Cardiovascular:  Negative for chest pain and palpitations.  Gastrointestinal:  Positive for abdominal pain and nausea. Negative for vomiting.  Endocrine: Negative for polydipsia and polyuria.  Genitourinary:  Negative for difficulty urinating and hematuria.  Musculoskeletal:  Negative for gait problem and joint swelling.  Skin:  Negative for pallor and rash.  Neurological:  Negative for syncope and headaches.  Psychiatric/Behavioral:  Negative for agitation and confusion.    Physical Exam Updated Vital Signs BP 121/75   Pulse 68   Temp 98.5 F (36.9 C) (Oral)   Resp 18   Ht 5\' 1"  (1.549 m)   Wt 65.9 kg   SpO2 100%   BMI 27.45 kg/m   Physical Exam Vitals and nursing note reviewed.  Constitutional:      General: She is not in acute distress.    Appearance: Normal appearance.  HENT:     Head: Normocephalic and atraumatic.     Right Ear: External ear normal.     Left Ear: External ear normal.     Nose: Nose normal.     Mouth/Throat:     Mouth: Mucous membranes are moist.  Eyes:     General: No scleral icterus.       Right eye: No discharge.        Left eye: No discharge.  Cardiovascular:     Rate and Rhythm: Normal rate and regular rhythm.     Pulses: Normal pulses.     Heart sounds: Normal heart sounds.  Pulmonary:     Effort: Pulmonary effort is  normal. No respiratory distress.     Breath sounds: Normal breath sounds.  Abdominal:     General: Abdomen is flat.     Tenderness: There is abdominal tenderness in the right lower quadrant. Positive signs include Rovsing's sign and McBurney's  sign.     Comments: Not peritoneal abdomen.  Also pain to the right upper quadrant  Musculoskeletal:        General: Normal range of motion.     Cervical back: Normal range of motion.     Right lower leg: No edema.     Left lower leg: No edema.     Comments: Walking boot LLE, sensation intact, no swelling  Skin:    General: Skin is warm and dry.     Capillary Refill: Capillary refill takes less than 2 seconds.  Neurological:     Mental Status: She is alert.  Psychiatric:        Mood and Affect: Mood normal.        Behavior: Behavior normal.    ED Results / Procedures / Treatments   Labs (all labs ordered are listed, but only abnormal results are displayed) Labs Reviewed  URINALYSIS, ROUTINE W REFLEX MICROSCOPIC - Abnormal; Notable for the following components:      Result Value   Ketones, ur 15 (*)    Protein, ur TRACE (*)    All other components within normal limits  LIPASE, BLOOD  COMPREHENSIVE METABOLIC PANEL  CBC  PREGNANCY, URINE    EKG None  Radiology CT ABDOMEN PELVIS W CONTRAST  Result Date: 02/11/2021 CLINICAL DATA:  Right lower quadrant abdominal pain. EXAM: CT ABDOMEN AND PELVIS WITH CONTRAST TECHNIQUE: Multidetector CT imaging of the abdomen and pelvis was performed using the standard protocol following bolus administration of intravenous contrast. CONTRAST:  50mL OMNIPAQUE IOHEXOL 300 MG/ML  SOLN COMPARISON:  CT abdomen pelvis 07/23/2018 FINDINGS: Lower chest: No acute abnormality. Hepatobiliary: No focal liver abnormality. No gallstones, gallbladder wall thickening, or pericholecystic fluid. No biliary dilatation. Pancreas: No focal lesion. Normal pancreatic contour. No surrounding inflammatory changes. No main  pancreatic ductal dilatation. Spleen: Normal in size without focal abnormality. Adrenals/Urinary Tract: No adrenal nodule bilaterally. Bilateral kidneys enhance symmetrically. No hydronephrosis. No hydroureter. The urinary bladder is unremarkable. On delayed imaging, there is no urothelial wall thickening and there are no filling defects in the opacified portions of the bilateral collecting systems or ureters. Stomach/Bowel: Stomach is within normal limits. No evidence of bowel wall thickening or dilatation. Appendix appears normal. Vascular/Lymphatic: No abdominal aorta or iliac aneurysm. Mild atherosclerotic plaque of the aorta and its branches. No abdominal, pelvic, or inguinal lymphadenopathy. Reproductive: T-shaped intrauterine device noted within the region of the endometrial canal in grossly appropriate position. Uterus and bilateral adnexa are unremarkable. Other: No intraperitoneal free fluid. No intraperitoneal free gas. No organized fluid collection. Musculoskeletal: Tiny fat containing umbilical hernia. No suspicious lytic or blastic osseous lesions. No acute displaced fracture. IMPRESSION: 1. No acute intra-abdominal or intrapelvic abnormality. 2. T-shaped intrauterine device in grossly appropriate position. Electronically Signed   By: Iven Finn M.D.   On: 02/11/2021 18:35    Procedures Procedures   Medications Ordered in ED Medications  sodium chloride 0.9 % bolus 1,000 mL (0 mLs Intravenous Stopped 02/11/21 1925)  morphine 4 MG/ML injection 4 mg (4 mg Intravenous Given 02/11/21 1749)  ondansetron (ZOFRAN) injection 4 mg (4 mg Intravenous Given 02/11/21 1748)  famotidine (PEPCID) IVPB 20 mg premix (0 mg Intravenous Stopped 02/11/21 1823)  iohexol (OMNIPAQUE) 300 MG/ML solution 100 mL (80 mLs Intravenous Contrast Given 02/11/21 1758)    ED Course  I have reviewed the triage vital signs and the nursing notes.  Pertinent labs & imaging results that were available during my care of the  patient  were reviewed by me and considered in my medical decision making (see chart for details).    MDM Rules/Calculators/A&P                           CC: abd pain  This patient complains of above; this involves an extensive number of treatment options and is a complaint that carries with it a high risk of complications and morbidity. Vital signs were reviewed. Serious etiologies considered.  Record review:  Previous records obtained and reviewed   Work up as above, notable for:  Labs & imaging results that were available during my care of the patient were reviewed by me and considered in my medical decision making.   I ordered imaging studies which included CT a/p and I independently visualized and interpreted imaging which showed no acute process, IUD in place  Management: IVF, zofran, morphine, pepcid   Reassessment:  Pt does report her symptoms have improved with intervention in the ED. No clear etiology for her discomfort at this time although symptoms have greatly improved. Ultrasound is not available at this facility at this time, discussed options for obtaining imaging with the patient including transfer to obtain imaging, observation/admission. She prefers to go home and follow-up tomorrow for ultrasound.  Discussed very strict return precautions. Supportive care at home. Order placed for [place] for tomorrow per patient's request.  The patient's overall condition has improved, the patient presents with abdominal pain without signs of peritonitis, or other life-threatening serious etiology. The patient understands that at this time there is no evidence for a more malignant underlying process, but the patient also understands that early in the process of an illness, an emergency department workup can be falsely reassuring. Detailed discussions were had with the patient regarding current findings, and need for close f/u with PCP or on call doctor. The patient appears stable for discharge  and has been instructed to return immediately if the symptoms worsen in any way, or in 8-12hr if not improved for re-evaluation. Patient verbalized understanding and is in agreement with current care plan.  All questions answered prior to discharge.       This chart was dictated using voice recognition software.  Despite best efforts to proofread,  errors can occur which can change the documentation meaning.  Final Clinical Impression(s) / ED Diagnoses Final diagnoses:  Lower abdominal pain    Rx / DC Orders ED Discharge Orders          Ordered    US PELVIC COMPLETE W TRANSVAGINAL AND TORSION R/O        02/11/21 2008    HYDROcodone-acetaminophen (NORCO/VICODIN) 5-325 MG tablet  Every 4 hours PRN        02/11/21 2009    dicyclomine (BENTYL) 20 MG tablet  2 times daily        02/11/21 2009    sucralfate (CARAFATE) 1 g tablet  3 times daily with meals & bedtime        02/11/21 2009             Jeanell Sparrow, DO 02/11/21 2013

## 2021-02-11 NOTE — ED Triage Notes (Signed)
Patient here POV from Home with ABD Pain.  Patient states Pain began at 0700 today and has not subsided. Pain is located in the Lower ABD. Mild Nausea. No Vomiting.  NAD Noted during Triage. A&Ox4. GCS 15. Ambulatory with Crutches. No Fevers.

## 2021-02-12 ENCOUNTER — Ambulatory Visit (HOSPITAL_BASED_OUTPATIENT_CLINIC_OR_DEPARTMENT_OTHER)
Admission: RE | Admit: 2021-02-12 | Discharge: 2021-02-12 | Disposition: A | Discharge status: Home / Self Care | Payer: Managed Care, Other (non HMO) | Source: Ambulatory Visit | Attending: Emergency Medicine | Admitting: Emergency Medicine

## 2021-02-12 DIAGNOSIS — Z975 Presence of (intrauterine) contraceptive device: Secondary | ICD-10-CM | POA: Insufficient documentation

## 2021-02-12 DIAGNOSIS — R1031 Right lower quadrant pain: Secondary | ICD-10-CM | POA: Diagnosis present

## 2021-02-14 ENCOUNTER — Encounter: Payer: Self-pay | Admitting: Orthopaedic Surgery

## 2021-02-14 ENCOUNTER — Ambulatory Visit: Payer: Self-pay

## 2021-02-14 ENCOUNTER — Ambulatory Visit: Payer: Managed Care, Other (non HMO) | Admitting: Orthopaedic Surgery

## 2021-02-14 DIAGNOSIS — S8265XA Nondisplaced fracture of lateral malleolus of left fibula, initial encounter for closed fracture: Secondary | ICD-10-CM | POA: Diagnosis not present

## 2021-02-14 NOTE — Progress Notes (Signed)
The patient is a 45 year old female who is just over 2 weeks out from a left ankle injury in which she stepped off a curb and twisted her ankle.  She sustained a Weber A lateral malleolus fracture of the distal fibula.  She is in a cam walking boot and weightbearing as tolerated.  She has having some swelling and soreness.  She is only been weightbearing in the boot for about 2 days now.  On examination there is extensive bruising and swelling around her foot and ankle to be expected.  She can suddenly flex and extend her ankle on the left side.  3 views left ankle show that the fracture remains minimally displaced and the ankle mortise is congruent intact.  She can continue to weight-bear as tolerated in the cam walking boot and will transition to an ASO when she is comfortable.  I would like to see her back in 4 weeks with a repeat 3 views left ankle.  All question concerns were answered and addressed.

## 2021-03-14 ENCOUNTER — Encounter: Payer: Self-pay | Admitting: Orthopaedic Surgery

## 2021-03-14 ENCOUNTER — Ambulatory Visit (INDEPENDENT_AMBULATORY_CARE_PROVIDER_SITE_OTHER): Payer: Managed Care, Other (non HMO)

## 2021-03-14 ENCOUNTER — Ambulatory Visit (INDEPENDENT_AMBULATORY_CARE_PROVIDER_SITE_OTHER): Payer: Managed Care, Other (non HMO) | Admitting: Orthopaedic Surgery

## 2021-03-14 DIAGNOSIS — S8265XA Nondisplaced fracture of lateral malleolus of left fibula, initial encounter for closed fracture: Secondary | ICD-10-CM

## 2021-03-14 DIAGNOSIS — S8265XD Nondisplaced fracture of lateral malleolus of left fibula, subsequent encounter for closed fracture with routine healing: Secondary | ICD-10-CM

## 2021-03-14 NOTE — Progress Notes (Signed)
The patient is now 6 weeks into a mechanical fall injuring her left ankle.  She sustained a Weber a distal fibula fracture of the lateral malleolus of her left ankle.  She is transition to an ASO.  She feels like the cam walker provides her some more support but it does hurt more than the day.  She has been going back and forth.  On exam her ankle feels stable on ligamentous exam.  There is still some slight swelling at the lateral malleolus when you compared to the noninjured right side.  She has good range of motion.  She is neurovascular intact.  The skin is intact.  NuPrep 3 views of the left ankle show the fracture is beginning to heal.  The ankle mortise is still intact.  She will continue to go slow with her ankle.  She will avoid high impact aerobic activities.  We will see her back in 4 weeks with 2 views of the left ankle and these need to be in AP and mortise view of the left ankle.

## 2021-03-15 ENCOUNTER — Ambulatory Visit: Payer: Managed Care, Other (non HMO) | Admitting: Family Medicine

## 2021-03-15 ENCOUNTER — Encounter: Payer: Self-pay | Admitting: Family Medicine

## 2021-03-15 ENCOUNTER — Other Ambulatory Visit: Payer: Self-pay

## 2021-03-15 VITALS — BP 130/86 | HR 84 | Temp 97.9°F | Ht 61.0 in | Wt 147.6 lb

## 2021-03-15 DIAGNOSIS — K580 Irritable bowel syndrome with diarrhea: Secondary | ICD-10-CM

## 2021-03-15 DIAGNOSIS — M545 Low back pain, unspecified: Secondary | ICD-10-CM | POA: Diagnosis not present

## 2021-03-15 DIAGNOSIS — F4322 Adjustment disorder with anxiety: Secondary | ICD-10-CM

## 2021-03-15 DIAGNOSIS — S8265XD Nondisplaced fracture of lateral malleolus of left fibula, subsequent encounter for closed fracture with routine healing: Secondary | ICD-10-CM | POA: Diagnosis not present

## 2021-03-15 NOTE — Progress Notes (Signed)
Subjective  CC:  Chief Complaint  Patient presents with   Back Pain    Has improved some   Anxiety    HPI: Alison Tucker is a 45 y.o. female who presents to the office today to address the problems listed above in the chief complaint. Follow-up low back pain: Treated in October for radicular low back pain with prednisone.  Fortunately she is done well.  Back pain is not completely resolved but is much better.  Reviewed her history of low back pain with thorough evaluation when she lived in [place] many years ago.  She also saw a rheumatologist.  She cannot remember a clear diagnosis and we requested records.  Lumbar imaging was normal recently and lab work was unremarkable for inflammatory disease and HLA-B27 was negative.  She reports that she is mostly back to her baseline however continues to have some low back pain that is positional.  No longer radicular. Reviewed ER notes and orthopedic notes.  She twisted her left ankle and fractured the fibula.  She is recovering well but still wearing a brace and with a limp.  This has exacerbated some of her back pain. Adjustment disorder with anxiety: Ankle fracture has increased her stressors but overall she has stabilized.  Using Klonopin infrequently for bad days.  Denies symptoms of depression or worsening anxiety. Reviewed ER note: Abdominal pain with unremarkable work-up.  Fortunately, this has resolved.  Assessment  1. Low back pain, episodic   2. Adjustment disorder with anxiety   3. Closed nondisplaced fracture of lateral malleolus of left fibula with routine healing, subsequent encounter   4. Irritable bowel syndrome with diarrhea      Plan  Low back pain episodic: Musculoskeletal in nature.  Request old records.  Continue as needed Tylenol and stretching.  If symptoms do not completely resolve, will order physical therapy.  Patient agrees with care plan Adjustment disorder with anxiety: Stabilizing.  As needed Klonopin.  No  indication for further medication intervention at this time.  Patient will monitor her symptoms Ankle fracture per Ortho: Healing IBS.  Monitor symptoms  Follow up: Complete physical 07/26/2021  No orders of the defined types were placed in this encounter.  No orders of the defined types were placed in this encounter.     I reviewed the patients updated PMH, FH, and SocHx.    Patient Active Problem List   Diagnosis Date Noted   Adjustment disorder with anxiety 01/17/2021   Low back pain, episodic 01/17/2021   IUD (intrauterine device) in place - mirena 2018 09/17/2017   Family history of brain cancer 09/17/2017   Family history of brain stem cancer 09/17/2017   IBS (irritable bowel syndrome) 03/10/2013   Current Meds  Medication Sig   clonazePAM (KLONOPIN) 0.5 MG tablet Take 1 tablet (0.5 mg total) by mouth daily as needed for anxiety.   gabapentin (NEURONTIN) 300 MG capsule Take 1 capsule (300 mg total) by mouth in the morning and at bedtime.   ibuprofen (ADVIL,MOTRIN) 200 MG tablet Take 200 mg by mouth as needed.   levonorgestrel (MIRENA) 20 MCG/24HR IUD 1 each by Intrauterine route once.    Allergies: Patient has No Known Allergies. Family History: Patient family history includes Alcohol abuse in her maternal grandfather; Arthritis in her maternal grandmother and mother; Brain cancer in an other family member; Brain cancer (age of onset: 35) in her son; Hyperlipidemia in her mother; Hypertension in her mother; Learning disabilities in her daughter; Mental retardation  in her daughter; Seizures in her daughter; Stroke in her mother. Social History:  Patient  reports that she has quit smoking. She has never been exposed to tobacco smoke. She has never used smokeless tobacco. She reports current alcohol use. She reports that she does not use drugs.  Review of Systems: Constitutional: Negative for fever malaise or anorexia Cardiovascular: negative for chest pain Respiratory:  negative for SOB or persistent cough Gastrointestinal: negative for abdominal pain  Objective  Vitals: BP 130/86   Pulse 84   Temp 97.9 F (36.6 C) (Temporal)   Ht 5' 1"  (1.549 m)   Wt 147 lb 9.6 oz (67 kg)   SpO2 96%   BMI 27.89 kg/m  General: no acute distress , A&Ox3 Appears well, moves easily about the clinic.    Commons side effects, risks, benefits, and alternatives for medications and treatment plan prescribed today were discussed, and the patient expressed understanding of the given instructions. Patient is instructed to call or message via MyChart if he/she has any questions or concerns regarding our treatment plan. No barriers to understanding were identified. We discussed Red Flag symptoms and signs in detail. Patient expressed understanding regarding what to do in case of urgent or emergency type symptoms.  Medication list was reconciled, printed and provided to the patient in AVS. Patient instructions and summary information was reviewed with the patient as documented in the AVS. This note was prepared with assistance of Dragon voice recognition software. Occasional wrong-word or sound-a-like substitutions may have occurred due to the inherent limitations of voice recognition software  This visit occurred during the SARS-CoV-2 public health emergency.  Safety protocols were in place, including screening questions prior to the visit, additional usage of staff PPE, and extensive cleaning of exam room while observing appropriate contact time as indicated for disinfecting solutions.

## 2021-04-11 ENCOUNTER — Other Ambulatory Visit: Payer: Self-pay

## 2021-04-11 ENCOUNTER — Ambulatory Visit (INDEPENDENT_AMBULATORY_CARE_PROVIDER_SITE_OTHER): Payer: Managed Care, Other (non HMO)

## 2021-04-11 ENCOUNTER — Ambulatory Visit (INDEPENDENT_AMBULATORY_CARE_PROVIDER_SITE_OTHER): Payer: Managed Care, Other (non HMO) | Admitting: Orthopaedic Surgery

## 2021-04-11 ENCOUNTER — Encounter: Payer: Self-pay | Admitting: Orthopaedic Surgery

## 2021-04-11 DIAGNOSIS — S8265XD Nondisplaced fracture of lateral malleolus of left fibula, subsequent encounter for closed fracture with routine healing: Secondary | ICD-10-CM

## 2021-04-11 NOTE — Progress Notes (Signed)
The patient is now 10 weeks status post a lateral malleolus fracture of her left ankle that was down at the distal end of the lateral malleolus/fibula.  This was a Weber A fracture.  She has been wearing a lace up ankle brace and has been experiencing some pain with increasing her activities as well as weather changes.  On exam she is point tender to palpation over the lateral malleolus at the distal edge of it.  There is no significant ankle effusion and she has excellent range of motion of the left ankle.  Is also ligamentously stable.  An AP and mortise of the left ankle show the fracture still visible.  There is been some interval healing but there is concerned that there is a nonunion that is developing.  Of note the patient is not a diabetic and not a smoker.  At this point we need to obtain a CT scan of the left ankle to assess for nonunion of the left lateral malleolus fracture.  We will see her back in 2 weeks and at that point will be 3 months since her fracture.

## 2021-04-29 ENCOUNTER — Ambulatory Visit: Payer: Managed Care, Other (non HMO) | Admitting: Orthopaedic Surgery

## 2021-05-02 ENCOUNTER — Ambulatory Visit
Admission: RE | Admit: 2021-05-02 | Discharge: 2021-05-02 | Disposition: A | Discharge status: Home / Self Care | Payer: Managed Care, Other (non HMO) | Source: Ambulatory Visit | Attending: Orthopaedic Surgery | Admitting: Orthopaedic Surgery

## 2021-05-02 ENCOUNTER — Other Ambulatory Visit: Payer: Self-pay

## 2021-05-02 DIAGNOSIS — S8265XD Nondisplaced fracture of lateral malleolus of left fibula, subsequent encounter for closed fracture with routine healing: Secondary | ICD-10-CM

## 2021-05-09 ENCOUNTER — Other Ambulatory Visit: Payer: Self-pay

## 2021-05-09 ENCOUNTER — Encounter: Payer: Self-pay | Admitting: Orthopaedic Surgery

## 2021-05-09 ENCOUNTER — Ambulatory Visit: Payer: Managed Care, Other (non HMO) | Admitting: Orthopaedic Surgery

## 2021-05-09 DIAGNOSIS — S8265XK Nondisplaced fracture of lateral malleolus of left fibula, subsequent encounter for closed fracture with nonunion: Secondary | ICD-10-CM

## 2021-05-09 DIAGNOSIS — S8265XD Nondisplaced fracture of lateral malleolus of left fibula, subsequent encounter for closed fracture with routine healing: Secondary | ICD-10-CM | POA: Diagnosis not present

## 2021-05-09 NOTE — Progress Notes (Signed)
The patient is a healthy 46 year old female who is following up after we obtain a CT scan of her left ankle due to a fracture of the lateral malleolus/fibula that has not been healing after her original injury that occurred on 01/27/2021.  It has now been well over 90 days since she injured her ankle.  It still hurts on the lower aspect of her ankle and plain films were suggestive of a nonunion.  She is here today to go over the CT scan.  She still is having left ankle pain and does wear a left ankle brace.  The CT scan is reviewed with her.  There is evidence of a nonunion of her left ankle lateral malleolus fracture.  This is easily seen on CT scan.  At this point we would like to try an ultrasound bone stimulator to hopefully help with bone healing.  I think this is medically necessary and reasonable at this point given that she is over 90 days / 12 weeks out from her left lateral malleolus fracture.  Once she has the bone stimulator in place, I would see her back in about 6 weeks after that for repeat 3 views of her left ankle.  She agrees with this treatment plan.  We will be in touch once we have this approved.  Of note, she is not a diabetic and not a smoker.

## 2021-05-16 ENCOUNTER — Other Ambulatory Visit: Payer: Self-pay

## 2021-05-16 ENCOUNTER — Ambulatory Visit: Payer: Managed Care, Other (non HMO)

## 2021-06-27 ENCOUNTER — Ambulatory Visit (INDEPENDENT_AMBULATORY_CARE_PROVIDER_SITE_OTHER): Payer: Managed Care, Other (non HMO)

## 2021-06-27 ENCOUNTER — Ambulatory Visit (INDEPENDENT_AMBULATORY_CARE_PROVIDER_SITE_OTHER): Payer: Managed Care, Other (non HMO) | Admitting: Orthopaedic Surgery

## 2021-06-27 DIAGNOSIS — S8265XD Nondisplaced fracture of lateral malleolus of left fibula, subsequent encounter for closed fracture with routine healing: Secondary | ICD-10-CM

## 2021-06-27 NOTE — Progress Notes (Signed)
HPI: Alison Tucker returns today follow-up of her left lateral malleolus fracture.  The date injury was 01/27/2021.  She is found to have a nonunion.  She has been using the bone stimulator for now 6 weeks.  She states she still has pain at times but not all the time.  She is in a regular shoe. ? ?Physical exam: Left ankle full dorsiflexion plantarflexion.  She has tenderness just anterior to the lateral malleolus.  Slight edema. ? ?Radiographs: Left ankle 3 views: Shows no change in overall position alignment of the Weber A fracture.  There is been further consolidation at the fracture site.  Otherwise the talus well located within the ankle mortise no diastases. ? ? ?Impression: Left lateral malleolus nonunion ? ?Plan: She will continue the bone stimulator.  We will see her back in 3 weeks and obtain 3 views of the left ankle at that time.  Questions were encouraged and answered.  No high-impact activities. ? ? ?

## 2021-07-26 ENCOUNTER — Encounter: Payer: Self-pay | Admitting: Family Medicine

## 2021-07-26 ENCOUNTER — Other Ambulatory Visit (HOSPITAL_COMMUNITY)
Admission: RE | Admit: 2021-07-26 | Discharge: 2021-07-26 | Disposition: A | Discharge status: Home / Self Care | Payer: Managed Care, Other (non HMO) | Source: Ambulatory Visit | Attending: Family Medicine | Admitting: Family Medicine

## 2021-07-26 ENCOUNTER — Ambulatory Visit (INDEPENDENT_AMBULATORY_CARE_PROVIDER_SITE_OTHER): Payer: Managed Care, Other (non HMO) | Admitting: Family Medicine

## 2021-07-26 VITALS — BP 114/80 | HR 100 | Temp 98.6°F | Ht 61.0 in | Wt 149.8 lb

## 2021-07-26 DIAGNOSIS — Z1231 Encounter for screening mammogram for malignant neoplasm of breast: Secondary | ICD-10-CM

## 2021-07-26 DIAGNOSIS — F4322 Adjustment disorder with anxiety: Secondary | ICD-10-CM

## 2021-07-26 DIAGNOSIS — H1045 Other chronic allergic conjunctivitis: Secondary | ICD-10-CM | POA: Insufficient documentation

## 2021-07-26 DIAGNOSIS — Z0000 Encounter for general adult medical examination without abnormal findings: Secondary | ICD-10-CM | POA: Insufficient documentation

## 2021-07-26 DIAGNOSIS — Z975 Presence of (intrauterine) contraceptive device: Secondary | ICD-10-CM | POA: Diagnosis not present

## 2021-07-26 DIAGNOSIS — M545 Low back pain, unspecified: Secondary | ICD-10-CM

## 2021-07-26 DIAGNOSIS — Z124 Encounter for screening for malignant neoplasm of cervix: Secondary | ICD-10-CM

## 2021-07-26 DIAGNOSIS — K580 Irritable bowel syndrome with diarrhea: Secondary | ICD-10-CM

## 2021-07-26 DIAGNOSIS — J301 Allergic rhinitis due to pollen: Secondary | ICD-10-CM | POA: Insufficient documentation

## 2021-07-26 DIAGNOSIS — J3081 Allergic rhinitis due to animal (cat) (dog) hair and dander: Secondary | ICD-10-CM | POA: Insufficient documentation

## 2021-07-26 LAB — COMPREHENSIVE METABOLIC PANEL
ALT: 25 U/L (ref 0–35)
AST: 21 U/L (ref 0–37)
Albumin: 4.3 g/dL (ref 3.5–5.2)
Alkaline Phosphatase: 48 U/L (ref 39–117)
BUN: 12 mg/dL (ref 6–23)
CO2: 28 mEq/L (ref 19–32)
Calcium: 9.3 mg/dL (ref 8.4–10.5)
Chloride: 106 mEq/L (ref 96–112)
Creatinine, Ser: 0.64 mg/dL (ref 0.40–1.20)
GFR: 107.26 mL/min (ref >60.00)
Glucose, Bld: 102 mg/dL — ABNORMAL HIGH (ref 70–99)
Potassium: 4.1 mEq/L (ref 3.5–5.1)
Sodium: 142 mEq/L (ref 135–145)
Total Bilirubin: 0.4 mg/dL (ref 0.2–1.2)
Total Protein: 7.6 g/dL (ref 6.0–8.3)

## 2021-07-26 LAB — CBC WITH DIFFERENTIAL/PLATELET
Basophils Absolute: 0 10*3/uL (ref 0.0–0.1)
Basophils Relative: 0.6 % (ref 0.0–3.0)
Eosinophils Absolute: 0.1 10*3/uL (ref 0.0–0.7)
Eosinophils Relative: 2.4 % (ref 0.0–5.0)
HCT: 38.5 % (ref 36.0–46.0)
Hemoglobin: 13.1 g/dL (ref 12.0–15.0)
Lymphocytes Relative: 35 % (ref 12.0–46.0)
Lymphs Abs: 2.1 10*3/uL (ref 0.7–4.0)
MCHC: 33.9 g/dL (ref 30.0–36.0)
MCV: 98.1 fl (ref 78.0–100.0)
Monocytes Absolute: 0.6 10*3/uL (ref 0.1–1.0)
Monocytes Relative: 10.9 % (ref 3.0–12.0)
Neutro Abs: 3 10*3/uL (ref 1.4–7.7)
Neutrophils Relative %: 51.1 % (ref 43.0–77.0)
Platelets: 290 10*3/uL (ref 150.0–400.0)
RBC: 3.93 Mil/uL (ref 3.87–5.11)
RDW: 13.2 % (ref 11.5–15.5)
WBC: 5.9 10*3/uL (ref 4.0–10.5)

## 2021-07-26 LAB — LIPID PANEL
Cholesterol: 234 mg/dL — ABNORMAL HIGH (ref 0–200)
HDL: 45.5 mg/dL (ref >39.00)
NonHDL: 188.48
Total CHOL/HDL Ratio: 5
Triglycerides: 218 mg/dL — ABNORMAL HIGH (ref 0.0–149.0)
VLDL: 43.6 mg/dL — ABNORMAL HIGH (ref 0.0–40.0)

## 2021-07-26 LAB — LDL CHOLESTEROL, DIRECT: Direct LDL: 171 mg/dL

## 2021-07-26 NOTE — Progress Notes (Signed)
?Subjective  ?Chief Complaint  ?Patient presents with  ? Annual Exam  ?  Pt is here for Annual exam and is not fasting  ? ? ?HPI: Alison Tucker is a 46 y.o. female who presents to [facility] today for a Female Wellness Visit. She also has the concerns and/or needs as listed above in the chief complaint. These will be addressed in addition to the Health Maintenance Visit.  ? ?Wellness Visit: annual visit with health maintenance review and exam with Pap ? ?HM: due pap and mammo. Iud mirena placed 2018.  Amenorrheic.  Immunizations are current.  Healthy active lifestyle, working and mother of 3 teens.  Discussed need for contraception and perimenopausal care. ?Chronic disease f/u and/or acute problem visit: (deemed necessary to be done in addition to the wellness visit): ?IBS and history of anxiety: Both are very well controlled.  Managing stressors well.  Home life is now good.  Rare Klonopin use.  Managing IBS symptoms behaviorally and with smaller portions.  This is working well. ?Microcytic low back pain: No recent flares. ? ?Assessment  ?1. Annual physical exam   ?2. Cervical cancer screening   ?3. IUD (intrauterine device) in place - mirena 2018   ?4. Irritable bowel syndrome with diarrhea   ?5. Adjustment disorder with anxiety   ?6. Low back pain, episodic   ?7. Encounter for screening mammogram for breast cancer   ? ?  ?Plan  ?Female Wellness Visit: ?Age appropriate Health Maintenance and Prevention measures were discussed with patient. Included topics are cancer screening recommendations, ways to keep healthy (see AVS) including dietary and exercise recommendations, regular eye and dental care, use of seat belts, and avoidance of moderate alcohol use and tobacco use.  Pap smear with high-risk HPV testing today.  Mammogram scheduled.  Will be due for colorectal cancer screening next year ?BMI: discussed patient's BMI and encouraged positive lifestyle modifications to help get to  or maintain a target BMI. ?HM needs and immunizations were addressed and ordered. See below for orders. See HM and immunization section for updates.  Current ?Routine labs and screening tests ordered including cmp, cbc and lipids where appropriate. ?Discussed recommendations regarding Vit D and calcium supplementation (see AVS) ? ?Chronic disease management visit and/or acute problem visit: ?IUD: Continue for another year.  Then can there removal and replacement to get her through premenopausal years.  No concerns ?IBS is well controlled with behavioral management and diet ?Adjustment disorder with anxiety is now much better controlled.  Rare Klonopin use. ?Low back pain: Discussed core strengthening and stretching exercises ? ?Follow up: Return in about 1 year (around 07/27/2022) for complete physical.  ?Orders Placed This Encounter  ?Procedures  ? MM DIGITAL SCREENING W/ IMPLANTS BILATERAL  ? CBC with Differential/Platelet  ? Comp Met (CMET)  ? Lipid Profile  ? Hepatitis C antibody  ? ?No orders of the defined types were placed in this encounter. ? ?  ? ?Body mass index is 28.3 kg/m?. ?Wt Readings from Last 3 Encounters:  ?07/26/21 149 lb 12.8 oz (67.9 kg)  ?03/15/21 147 lb 9.6 oz (67 kg)  ?02/11/21 145 lb 4.5 oz (65.9 kg)  ? ? ? ?Patient Active Problem List  ? Diagnosis Date Noted  ? Allergic rhinitis due to animal (cat) (dog) hair and dander 07/26/2021  ? Allergic rhinitis due to pollen 07/26/2021  ? Chronic allergic conjunctivitis 07/26/2021  ? Adjustment disorder with anxiety 01/17/2021  ? Low back pain, episodic 01/17/2021  ?  IUD (intrauterine device) in place - mirena 2018 09/17/2017  ? Family history of brain cancer 09/17/2017  ?  Son, negative genetic screening ? ?  ? Family history of brain stem cancer 09/17/2017  ?  Sister's daughter; neg genetic testing ? ?  ? IBS (irritable bowel syndrome) 03/10/2013  ? ?Health Maintenance  ?Topic Date Due  ? MAMMOGRAM  Never done  ? Hepatitis C Screening  Never done  ?  PAP SMEAR-Modifier  04/08/2019  ? INFLUENZA VACCINE  11/05/2021  ? TETANUS/TDAP  08/19/2023  ? COVID-19 Vaccine  Completed  ? HIV Screening  Completed  ? HPV VACCINES  Aged Out  ? ?Immunization History  ?Administered Date(s) Administered  ? Influenza Inj Mdck Quad With Preservative 12/20/2020  ? Influenza, High Dose Seasonal PF 03/09/2019  ? Influenza, Quadrivalent, Recombinant, Inj, Pf 01/18/2018  ? MMR 08/18/2013, 08/18/2013  ? PFIZER(Purple Top)SARS-COV-2 Vaccination 06/19/2019, 07/11/2019, 08/18/2019, 03/10/2020  ? Pension scheme manager 78yr & up 12/26/2020  ? Tdap 01/02/2009, 08/18/2013  ? Varicella 08/18/2013  ? ?We updated and reviewed the patient's past history in detail and it is documented below. ?Allergies: ?Patient has No Known Allergies. ?Past Medical History ?Patient  has a past medical history of Chicken pox, Depression, Family history of brain cancer (09/17/2017), Family history of brain stem cancer (09/17/2017), and IUD (intrauterine device) in place - mirena 2018 (09/17/2017). ?Past Surgical History ?Patient  has a past surgical history that includes Cosmetic surgery (2010). ?Family History: ?Patient family history includes Alcohol abuse in her maternal grandfather; Arthritis in her maternal grandmother and mother; Brain cancer in an other family member; Brain cancer (age of onset: 172 in her son; Hyperlipidemia in her mother; Hypertension in her mother; Learning disabilities in her daughter; Mental retardation in her daughter; Seizures in her daughter; Stroke in her mother. ?Social History:  ?Patient  reports that she has quit smoking. She has never been exposed to tobacco smoke. She has never used smokeless tobacco. She reports current alcohol use. She reports that she does not use drugs. ? ?Review of Systems: ?Constitutional: negative for fever or malaise ?Ophthalmic: negative for photophobia, double vision or loss of vision ?Cardiovascular: negative for chest pain, dyspnea on  exertion, or new LE swelling ?Respiratory: negative for SOB or persistent cough ?Gastrointestinal: negative for abdominal pain, change in bowel habits or melena ?Genitourinary: negative for dysuria or gross hematuria, no abnormal uterine bleeding or disharge ?Musculoskeletal: negative for new gait disturbance or muscular weakness ?Integumentary: negative for new or persistent rashes, no breast lumps ?Neurological: negative for TIA or stroke symptoms ?Psychiatric: negative for SI or delusions ?Allergic/Immunologic: negative for hives ? ?Patient Care Team  ?  Relationship Specialty Notifications Start End  ?ALeamon Arnt MD PCP - General Family Medicine  09/17/17   ?VHarold Hedge RDarrick Grinder MD Consulting Physician Allergy and Immunology  08/23/19   ? ? ?Objective  ?Vitals: BP 114/80   Pulse 100   Temp 98.6 ?F (37 ?C)   Ht 5' 1"  (1.549 m)   Wt 149 lb 12.8 oz (67.9 kg)   SpO2 97%   BMI 28.30 kg/m?  ?General:  Well developed, well nourished, no acute distress  ?Psych:  Alert and orientedx3,normal mood and affect ?HEENT:  Normocephalic, atraumatic, non-icteric sclera,  supple neck without adenopathy, mass or thyromegaly ?Cardiovascular:  Normal S1, S2, RRR without gallop, rub or murmur ?Respiratory:  Good breath sounds bilaterally, CTAB with normal respiratory effort ?Gastrointestinal: normal bowel sounds, soft, non-tender, no noted masses. No  HSM ?MSK: no deformities, contusions. Joints are without erythema or swelling.  ?Skin:  Warm, no rashes or suspicious lesions noted ?Neurologic:    Mental status is normal. CN 2-11 are normal. Gross motor and sensory exams are normal. Normal gait. No tremor ?Breast Exam: No mass, skin retraction or nipple discharge is appreciated in either breast. No axillary adenopathy. Fibrocystic changes are not noted.  Bilateral implants present ?Pelvic Exam: Normal external genitalia, no vulvar or vaginal lesions present. Clear cervix w/o CMT. IUD string visible.  Bimanual exam reveals a  nontender fundus w/o masses, nl size. No adnexal masses present. No inguinal adenopathy. A PAP smear was performed.  ? ?Commons side effects, risks, benefits, and alternatives for medications and treatment pl

## 2021-07-26 NOTE — Patient Instructions (Signed)
Please return in 12 months for your annual complete physical; please come fasting.  ? ?I will release your lab results to you on your MyChart account with further instructions. You may see the results before I do, but when I review them I will send you a message with my report or have my assistant call you if things need to be discussed. Please reply to my message with any questions. Thank you!  ? ?I have ordered a mammogram and/or bone density for you as we discussed today: ?[x]   Mammogram  ?[]   Bone Density ? ?Please call the office checked below to schedule your appointment: ? ?[x]   [facility]     ? [address]      ? [place],       ? [phone]        ? ?[]   [facility] ? [address] [place]  ? [place], [facility] ? [phone] ? ? ?If you have any questions or concerns, please don't hesitate to send me a message via MyChart or call the office at [phone]. Thank you for visiting with [facility] today! It's our pleasure caring for you.  ? ?Please do these things to maintain good health! ? ?Exercise at least 30-45 minutes a day,  4-5 days a week.  ?Eat a low-fat diet with lots of fruits and vegetables, up to 7-9 servings per day. ?Drink plenty of water daily. Try to drink 8 8oz glasses per day. ?Seatbelts can save your life. Always wear your seatbelt. ?Place Smoke Detectors on every level of your home and check batteries every year. ?Schedule an appointment with an eye doctor for an eye exam every 1-2 years ?Safe sex - use condoms to protect yourself from STDs if you could be exposed to these types of infections. Use birth control if you do not want to become pregnant and are sexually active. ?Avoid heavy alcohol use. If you drink, keep it to less than 2 drinks/day and not every day. ?Health Care Power of Attorney.  Choose someone you trust that could speak for you if you became unable to speak for yourself. ?Depression is common in our stressful world.If you're feeling  down or losing interest in things you normally enjoy, please come in for a visit. ?If anyone is threatening or hurting you, please get help. Physical or Emotional Violence is never OK.   ?

## 2021-07-31 ENCOUNTER — Encounter: Payer: Self-pay | Admitting: Family Medicine

## 2021-07-31 LAB — HEPATITIS C ANTIBODY
Hepatitis C Ab: NONREACTIVE
SIGNAL TO CUT-OFF: 0.14 (ref <1.00)

## 2021-07-31 LAB — CYTOLOGY - PAP
Comment: NEGATIVE
Comment: NEGATIVE
Comment: NEGATIVE
Diagnosis: NEGATIVE
HPV 16: NEGATIVE
HPV 18 / 45: NEGATIVE
High risk HPV: POSITIVE — AB

## 2021-08-02 ENCOUNTER — Ambulatory Visit
Admission: RE | Admit: 2021-08-02 | Discharge: 2021-08-02 | Disposition: A | Discharge status: Home / Self Care | Payer: Managed Care, Other (non HMO) | Source: Ambulatory Visit | Attending: Family Medicine | Admitting: Family Medicine

## 2021-08-02 ENCOUNTER — Encounter: Payer: Self-pay | Admitting: Family Medicine

## 2021-08-02 DIAGNOSIS — Z1231 Encounter for screening mammogram for malignant neoplasm of breast: Secondary | ICD-10-CM

## 2021-08-02 DIAGNOSIS — R87810 Cervical high risk human papillomavirus (HPV) DNA test positive: Secondary | ICD-10-CM | POA: Insufficient documentation

## 2021-08-02 DIAGNOSIS — Z0000 Encounter for general adult medical examination without abnormal findings: Secondary | ICD-10-CM

## 2021-08-06 ENCOUNTER — Other Ambulatory Visit: Payer: Self-pay | Admitting: Family Medicine

## 2021-08-06 DIAGNOSIS — R928 Other abnormal and inconclusive findings on diagnostic imaging of breast: Secondary | ICD-10-CM

## 2021-08-07 ENCOUNTER — Encounter: Payer: Self-pay | Admitting: Family Medicine

## 2021-08-15 ENCOUNTER — Encounter: Payer: Self-pay | Admitting: Orthopaedic Surgery

## 2021-08-15 ENCOUNTER — Ambulatory Visit
Admission: RE | Admit: 2021-08-15 | Discharge: 2021-08-15 | Disposition: A | Discharge status: Home / Self Care | Payer: Managed Care, Other (non HMO) | Source: Ambulatory Visit | Attending: Family Medicine | Admitting: Family Medicine

## 2021-08-15 ENCOUNTER — Ambulatory Visit (INDEPENDENT_AMBULATORY_CARE_PROVIDER_SITE_OTHER): Payer: Managed Care, Other (non HMO)

## 2021-08-15 ENCOUNTER — Ambulatory Visit (INDEPENDENT_AMBULATORY_CARE_PROVIDER_SITE_OTHER): Payer: Managed Care, Other (non HMO) | Admitting: Orthopaedic Surgery

## 2021-08-15 ENCOUNTER — Other Ambulatory Visit: Payer: Self-pay | Admitting: Family Medicine

## 2021-08-15 ENCOUNTER — Other Ambulatory Visit: Payer: Self-pay

## 2021-08-15 DIAGNOSIS — S8265XD Nondisplaced fracture of lateral malleolus of left fibula, subsequent encounter for closed fracture with routine healing: Secondary | ICD-10-CM | POA: Diagnosis not present

## 2021-08-15 DIAGNOSIS — R928 Other abnormal and inconclusive findings on diagnostic imaging of breast: Secondary | ICD-10-CM

## 2021-08-15 DIAGNOSIS — S8265XK Nondisplaced fracture of lateral malleolus of left fibula, subsequent encounter for closed fracture with nonunion: Secondary | ICD-10-CM

## 2021-08-15 NOTE — Progress Notes (Signed)
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  She has been using a bone stimulator since January.  She is not a diabetic and not a smoker.  She injured her ankle back in October of this past year. ? ?On exam there is still some pain over the distal fibula.  There is more pain over the peroneal tendons as well.  The tendons do not subluxate but there is still some slight swelling. ? ?3 views of the left ankle reviewed and compared to films from January.  There is been significant interval healing of the lateral malleolus fracture at the distal fibula. ? ?I would like to obtain an MRI of her left ankle this standpoint given the fact that there is pain along the peroneal tendons we need to rule out a rupture or tear of those tendons.  I would also like to see what the fibula is showing [place] in terms of a nonunion to.  We will see her back in follow-up after that study. ?

## 2021-08-22 ENCOUNTER — Encounter: Payer: Self-pay | Admitting: Family Medicine

## 2021-08-25 ENCOUNTER — Ambulatory Visit
Admission: RE | Admit: 2021-08-25 | Discharge: 2021-08-25 | Disposition: A | Discharge status: Home / Self Care | Payer: Managed Care, Other (non HMO) | Source: Ambulatory Visit | Attending: Orthopaedic Surgery | Admitting: Orthopaedic Surgery

## 2021-08-25 DIAGNOSIS — S8265XK Nondisplaced fracture of lateral malleolus of left fibula, subsequent encounter for closed fracture with nonunion: Secondary | ICD-10-CM

## 2021-09-05 ENCOUNTER — Ambulatory Visit: Payer: Managed Care, Other (non HMO) | Admitting: Orthopaedic Surgery

## 2021-09-08 ENCOUNTER — Encounter: Payer: Self-pay | Admitting: Orthopaedic Surgery

## 2021-09-09 NOTE — Telephone Encounter (Signed)
What day is best to get her in?

## 2021-09-12 ENCOUNTER — Ambulatory Visit: Payer: Managed Care, Other (non HMO) | Admitting: Orthopedic Surgery

## 2021-09-12 DIAGNOSIS — S8265XK Nondisplaced fracture of lateral malleolus of left fibula, subsequent encounter for closed fracture with nonunion: Secondary | ICD-10-CM | POA: Diagnosis not present

## 2021-09-16 ENCOUNTER — Encounter: Payer: Self-pay | Admitting: Orthopaedic Surgery

## 2021-09-20 ENCOUNTER — Encounter: Payer: Self-pay | Admitting: Orthopedic Surgery

## 2021-09-20 NOTE — Progress Notes (Signed)
Office Visit Note   Patient: Alison Tucker           Date of Birth: October 28, 1975           MRN: [id number] Visit Date: 09/12/2021              Requested by: Willow Ora, MD [address] [place],  [place] [address] PCP: Willow Ora, MD  Chief Complaint  Patient presents with   Left Ankle - Fracture      HPI: Patient is a 46 year old woman who sustained a nondisplaced Weber a fibular fracture left ankle initial injury in October 2022.  Patient has been using a bone stimulator and has about 1 week left on the bone stimulator.  She most recently has obtained an MRI scan of her left ankle.  Patient states she has pain and discomfort that comes and goes.  Assessment & Plan: Visit Diagnoses:  1. 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  Discussed that if she is still symptomatic we could proceed with takedown of the fibrous nonunion open reduction internal fixation.  Patient states she would like to proceed with surgery towards the end of July we will call to set this up at her convenience.  Follow-Up Instructions: Return in about 4 weeks (around 10/10/2021).   Ortho Exam  Patient is alert, oriented, no adenopathy, well-dressed, normal affect, normal respiratory effort. Examination patient has a strong palpable dorsalis pedis pulse she has good range of motion of the ankle subtalar joint the ankle is stable the syndesmosis is nontender.  Patient is point tender to palpation of the distal fibula.  Radiographs show no displacement review of the MRI scan shows a fibrous nonunion.  Imaging: No results found. No images are attached to the encounter.  Labs: Lab Results  Component Value Date   ESRSEDRATE 13 01/17/2021   CRP <1.0 01/17/2021     Lab Results  Component Value Date   ALBUMIN 4.3 07/26/2021   ALBUMIN 4.6 02/11/2021   ALBUMIN 4.2 09/20/2018     No results found for: "MG" No results found for: "VD25OH"  No results found for: "PREALBUMIN"    Latest Ref Rng & Units 07/26/2021    9:41 AM 02/11/2021    2:04 PM 09/20/2018    3:27 PM  CBC EXTENDED  WBC 4.0 - 10.5 K/uL 5.9  7.0  6.2   RBC 3.87 - 5.11 Mil/uL 3.93  4.27  4.02   Hemoglobin 12.0 - 15.0 g/dL 84.1  32.4  40.1   HCT 36.0 - 46.0 % 38.5  41.0  39.5   Platelets 150.0 - 400.0 K/uL 290.0  323  268.0   NEUT# 1.4 - 7.7 K/uL 3.0   3.5   Lymph# 0.7 - 4.0 K/uL 2.1   1.8      There is no height or weight on file to calculate BMI.  Orders:  No orders of the defined types were placed in this encounter.  No orders of the defined types were placed in this encounter.    Procedures: No procedures performed  Clinical Data: No additional findings.  ROS:  All other systems negative, except as noted in the HPI. Review of Systems  Objective: Vital Signs: There were no vitals taken for this visit.  Specialty Comments:  No specialty comments available.  PMFS History: Patient Active Problem List   Diagnosis Date  Noted   Cervical high risk HPV (human papillomavirus) test positive 08/02/2021   Allergic rhinitis due to animal (cat) (dog) hair and dander 07/26/2021   Allergic rhinitis due to pollen 07/26/2021   Chronic allergic conjunctivitis 07/26/2021   Adjustment disorder with anxiety 01/17/2021   Low back pain, episodic 01/17/2021   IUD (intrauterine device) in place - mirena 2018 09/17/2017   Family history of brain cancer 09/17/2017   Family history of brain stem cancer 09/17/2017   IBS (irritable bowel syndrome) 03/10/2013   Past Medical History:  Diagnosis Date   Chicken pox    Depression    Family history of brain cancer 09/17/2017   Son, negative genetic screening   Family history of brain stem cancer 09/17/2017   Sister's daughter; neg genetic testing   IUD (intrauterine device) in place - mirena 2018 09/17/2017    Family History  Problem Relation Age  of Onset   Arthritis Mother    Hyperlipidemia Mother    Hypertension Mother    Stroke Mother    Brain cancer Son 15       astrocytoma and glioblastoma   Arthritis Maternal Grandmother    Alcohol abuse Maternal Grandfather    Learning disabilities Daughter    Mental retardation Daughter    Seizures Daughter    Brain cancer Other        DIPG    Past Surgical History:  Procedure Laterality Date   AUGMENTATION MAMMAPLASTY Bilateral 2010   COSMETIC SURGERY  2010   Social History   Occupational History   Not on file  Tobacco Use   Smoking status: Former    Passive exposure: Never   Smokeless tobacco: Never  Vaping Use   Vaping Use: Some days   Substances: Nicotine, Flavoring  Substance and Sexual Activity   Alcohol use: Yes   Drug use: Never   Sexual activity: Yes

## 2021-10-01 ENCOUNTER — Encounter: Payer: Self-pay | Admitting: Orthopedic Surgery

## 2021-10-03 ENCOUNTER — Ambulatory Visit: Payer: Managed Care, Other (non HMO) | Admitting: Orthopaedic Surgery

## 2021-10-22 ENCOUNTER — Encounter (HOSPITAL_COMMUNITY): Payer: Self-pay | Admitting: Orthopedic Surgery

## 2021-10-22 ENCOUNTER — Other Ambulatory Visit: Payer: Self-pay

## 2021-10-22 NOTE — Progress Notes (Signed)
Alison Tucker denies chest pain or shortness of breath. Patient denies having any s/s of Covid in her household.  Patient denies any known exposure to Covid.   Alison Tucker 's PCP is Dr. Asencion Partridge.

## 2021-10-22 NOTE — Anesthesia Preprocedure Evaluation (Signed)
Anesthesia Evaluation  Patient identified by MRN, date of birth, ID band Patient awake    Reviewed: Allergy & Precautions, NPO status , Patient's Chart, lab work & pertinent test results  History of Anesthesia Complications Negative for: history of anesthetic complications  Airway Mallampati: II  TM Distance: >3 FB Neck ROM: Full    Dental no notable dental hx.    Pulmonary former smoker,    Pulmonary exam normal        Cardiovascular negative cardio ROS Normal cardiovascular exam     Neuro/Psych PSYCHIATRIC DISORDERS Anxiety Depression negative neurological ROS     GI/Hepatic negative GI ROS, Neg liver ROS,   Endo/Other  negative endocrine ROS  Renal/GU negative Renal ROS  negative genitourinary   Musculoskeletal negative musculoskeletal ROS (+)   Abdominal   Peds  Hematology negative hematology ROS (+)   Anesthesia Other Findings   Reproductive/Obstetrics                            Anesthesia Physical Anesthesia Plan  ASA: 2  Anesthesia Plan: General and Regional   Post-op Pain Management: Regional block* and Tylenol PO (pre-op)*   Induction: Intravenous  PONV Risk Score and Plan: 3 and Ondansetron, Dexamethasone and Midazolam  Airway Management Planned: LMA  Additional Equipment:   Intra-op Plan:   Post-operative Plan: Extubation in OR  Informed Consent: I have reviewed the patients History and Physical, chart, labs and discussed the procedure including the risks, benefits and alternatives for the proposed anesthesia with the patient or authorized representative who has indicated his/her understanding and acceptance.     Dental advisory given  Plan Discussed with: CRNA  Anesthesia Plan Comments:         Anesthesia Quick Evaluation

## 2021-10-23 ENCOUNTER — Ambulatory Visit (HOSPITAL_COMMUNITY): Payer: Managed Care, Other (non HMO)

## 2021-10-23 ENCOUNTER — Encounter (HOSPITAL_COMMUNITY)
Admission: RE | Disposition: A | Discharge status: Home / Self Care | Payer: Self-pay | Source: Home / Self Care | Attending: Orthopedic Surgery

## 2021-10-23 ENCOUNTER — Ambulatory Visit (HOSPITAL_COMMUNITY)
Admission: RE | Admit: 2021-10-23 | Discharge: 2021-10-23 | Disposition: A | Discharge status: Home / Self Care | Payer: Managed Care, Other (non HMO) | Attending: Orthopedic Surgery | Admitting: Orthopedic Surgery

## 2021-10-23 ENCOUNTER — Ambulatory Visit (HOSPITAL_BASED_OUTPATIENT_CLINIC_OR_DEPARTMENT_OTHER): Payer: Managed Care, Other (non HMO) | Admitting: Certified Registered Nurse Anesthetist

## 2021-10-23 ENCOUNTER — Other Ambulatory Visit: Payer: Self-pay

## 2021-10-23 ENCOUNTER — Ambulatory Visit (HOSPITAL_COMMUNITY): Payer: Managed Care, Other (non HMO) | Admitting: Certified Registered Nurse Anesthetist

## 2021-10-23 DIAGNOSIS — X58XXXD Exposure to other specified factors, subsequent encounter: Secondary | ICD-10-CM | POA: Insufficient documentation

## 2021-10-23 DIAGNOSIS — S8265XS Nondisplaced fracture of lateral malleolus of left fibula, sequela: Secondary | ICD-10-CM

## 2021-10-23 DIAGNOSIS — S8262XK Displaced fracture of lateral malleolus of left fibula, subsequent encounter for closed fracture with nonunion: Secondary | ICD-10-CM | POA: Diagnosis present

## 2021-10-23 DIAGNOSIS — S82402K Unspecified fracture of shaft of left fibula, subsequent encounter for closed fracture with nonunion: Secondary | ICD-10-CM

## 2021-10-23 DIAGNOSIS — Z87891 Personal history of nicotine dependence: Secondary | ICD-10-CM | POA: Diagnosis not present

## 2021-10-23 DIAGNOSIS — S8265XA Nondisplaced fracture of lateral malleolus of left fibula, initial encounter for closed fracture: Secondary | ICD-10-CM

## 2021-10-23 DIAGNOSIS — Z01818 Encounter for other preprocedural examination: Secondary | ICD-10-CM

## 2021-10-23 HISTORY — PX: ORIF ANKLE FRACTURE: SHX5408

## 2021-10-23 HISTORY — DX: Anxiety disorder, unspecified: F41.9

## 2021-10-23 LAB — POCT PREGNANCY, URINE: Preg Test, Ur: NEGATIVE

## 2021-10-23 SURGERY — OPEN REDUCTION INTERNAL FIXATION (ORIF) ANKLE FRACTURE
Anesthesia: Regional | Site: Ankle | Laterality: Left

## 2021-10-23 MED ORDER — LACTATED RINGERS IV SOLN
INTRAVENOUS | Status: DC
Start: 2021-10-23 — End: 2021-10-23

## 2021-10-23 MED ORDER — FENTANYL CITRATE (PF) 250 MCG/5ML IJ SOLN
INTRAMUSCULAR | Status: AC
  Filled 2021-10-23: qty 5

## 2021-10-23 MED ORDER — PHENYLEPHRINE 80 MCG/ML (10ML) SYRINGE FOR IV PUSH (FOR BLOOD PRESSURE SUPPORT)
PREFILLED_SYRINGE | INTRAVENOUS | Status: AC
  Filled 2021-10-23: qty 10

## 2021-10-23 MED ORDER — FENTANYL CITRATE (PF) 100 MCG/2ML IJ SOLN
INTRAMUSCULAR | Status: AC
  Administered 2021-10-23: 100 ug via INTRAVENOUS
  Filled 2021-10-23: qty 2

## 2021-10-23 MED ORDER — ONDANSETRON HCL 4 MG/2ML IJ SOLN
INTRAMUSCULAR | Status: DC | PRN
  Administered 2021-10-23: 4 mg via INTRAVENOUS

## 2021-10-23 MED ORDER — FENTANYL CITRATE (PF) 100 MCG/2ML IJ SOLN: 25.0000 ug | INTRAMUSCULAR | Status: DC | PRN

## 2021-10-23 MED ORDER — 0.9 % SODIUM CHLORIDE (POUR BTL) OPTIME
TOPICAL | Status: DC | PRN
  Administered 2021-10-23: 1000 mL

## 2021-10-23 MED ORDER — LIDOCAINE 2% (20 MG/ML) 5 ML SYRINGE
INTRAMUSCULAR | Status: AC
  Filled 2021-10-23: qty 10

## 2021-10-23 MED ORDER — PROPOFOL 10 MG/ML IV BOLUS
INTRAVENOUS | Status: AC
  Filled 2021-10-23: qty 20

## 2021-10-23 MED ORDER — FENTANYL CITRATE (PF) 250 MCG/5ML IJ SOLN
INTRAMUSCULAR | Status: DC | PRN
  Administered 2021-10-23 (×2): 25 ug via INTRAVENOUS

## 2021-10-23 MED ORDER — LIDOCAINE 2% (20 MG/ML) 5 ML SYRINGE
INTRAMUSCULAR | Status: DC | PRN
  Administered 2021-10-23: 60 mg via INTRAVENOUS

## 2021-10-23 MED ORDER — ACETAMINOPHEN 500 MG PO TABS
1000.0000 mg | ORAL_TABLET | Freq: Once | ORAL | Status: AC
Start: 2021-10-23 — End: 2021-10-23
  Administered 2021-10-23: 1000 mg via ORAL
  Filled 2021-10-23: qty 2

## 2021-10-23 MED ORDER — BUPIVACAINE-EPINEPHRINE (PF) 0.5% -1:200000 IJ SOLN: INTRAMUSCULAR | Status: DC | PRN

## 2021-10-23 MED ORDER — HYDROCODONE-ACETAMINOPHEN 5-325 MG PO TABS: 1.0000 | ORAL_TABLET | ORAL | 0 refills | Status: DC | PRN

## 2021-10-23 MED ORDER — CLONIDINE HCL (ANALGESIA) 100 MCG/ML EP SOLN
EPIDURAL | Status: DC | PRN
  Administered 2021-10-23: 67 ug
  Administered 2021-10-23: 33 ug

## 2021-10-23 MED ORDER — BUPIVACAINE-EPINEPHRINE (PF) 0.5% -1:200000 IJ SOLN
INTRAMUSCULAR | Status: DC | PRN
  Administered 2021-10-23: 20 mL via PERINEURAL
  Administered 2021-10-23: 10 mL via PERINEURAL

## 2021-10-23 MED ORDER — ORAL CARE MOUTH RINSE
15.0000 mL | Freq: Once | OROMUCOSAL | Status: AC
Start: 2021-10-23 — End: 2021-10-23

## 2021-10-23 MED ORDER — CHLORHEXIDINE GLUCONATE 0.12 % MT SOLN
15.0000 mL | Freq: Once | OROMUCOSAL | Status: AC
  Administered 2021-10-23: 15 mL via OROMUCOSAL
  Filled 2021-10-23: qty 15

## 2021-10-23 MED ORDER — MIDAZOLAM HCL 2 MG/2ML IJ SOLN
2.0000 mg | Freq: Once | INTRAMUSCULAR | Status: AC
Start: 2021-10-23 — End: 2021-10-23

## 2021-10-23 MED ORDER — CEFAZOLIN SODIUM-DEXTROSE 2-4 GM/100ML-% IV SOLN
2.0000 g | INTRAVENOUS | Status: AC
  Administered 2021-10-23: 2 g via INTRAVENOUS
  Filled 2021-10-23: qty 100

## 2021-10-23 MED ORDER — DEXAMETHASONE SODIUM PHOSPHATE 10 MG/ML IJ SOLN
INTRAMUSCULAR | Status: DC | PRN
  Administered 2021-10-23: 10 mg via INTRAVENOUS

## 2021-10-23 MED ORDER — MIDAZOLAM HCL 2 MG/2ML IJ SOLN
INTRAMUSCULAR | Status: AC
  Administered 2021-10-23: 2 mg via INTRAVENOUS
  Filled 2021-10-23: qty 2

## 2021-10-23 MED ORDER — PROPOFOL 10 MG/ML IV BOLUS
INTRAVENOUS | Status: DC | PRN
  Administered 2021-10-23: 150 mg via INTRAVENOUS

## 2021-10-23 MED ORDER — FENTANYL CITRATE (PF) 100 MCG/2ML IJ SOLN: 100.0000 ug | Freq: Once | INTRAMUSCULAR | Status: AC

## 2021-10-23 SURGICAL SUPPLY — 46 items
BAG COUNTER SPONGE SURGICOUNT (BAG) ×2 IMPLANT
BANDAGE ESMARK 6X9 LF (GAUZE/BANDAGES/DRESSINGS) IMPLANT
BIT DRILL 3.0 6IN (DRILL) IMPLANT
BLADE SURG 21 STRL SS (BLADE) ×1 IMPLANT
BNDG COHESIVE 4X5 TAN STRL (GAUZE/BANDAGES/DRESSINGS) ×1 IMPLANT
BNDG COHESIVE 6X5 TAN NS LF (GAUZE/BANDAGES/DRESSINGS) ×1 IMPLANT
BNDG ESMARK 6X9 LF (GAUZE/BANDAGES/DRESSINGS)
BNDG GAUZE DERMACEA FLUFF (GAUZE/BANDAGES/DRESSINGS) ×1
BNDG GAUZE DERMACEA FLUFF 4 (GAUZE/BANDAGES/DRESSINGS) IMPLANT
BNDG GAUZE ELAST 4 BULKY (GAUZE/BANDAGES/DRESSINGS) ×1 IMPLANT
COVER SURGICAL LIGHT HANDLE (MISCELLANEOUS) ×2 IMPLANT
DRAPE OEC MINIVIEW 54X84 (DRAPES) ×1 IMPLANT
DRAPE U-SHAPE 47X51 STRL (DRAPES) ×2 IMPLANT
DRILL 3.0 6IN (DRILL) ×2
DRSG ADAPTIC 3X8 NADH LF (GAUZE/BANDAGES/DRESSINGS) ×2 IMPLANT
DRSG PAD ABDOMINAL 8X10 ST (GAUZE/BANDAGES/DRESSINGS) ×1 IMPLANT
DURAPREP 26ML APPLICATOR (WOUND CARE) ×2 IMPLANT
ELECT REM PT RETURN 9FT ADLT (ELECTROSURGICAL) ×2
ELECTRODE REM PT RTRN 9FT ADLT (ELECTROSURGICAL) ×1 IMPLANT
GAUZE PAD ABD 8X10 STRL (GAUZE/BANDAGES/DRESSINGS) ×1 IMPLANT
GAUZE SPONGE 4X4 12PLY STRL (GAUZE/BANDAGES/DRESSINGS) ×1 IMPLANT
GAUZE SPONGE 4X4 12PLY STRL LF (GAUZE/BANDAGES/DRESSINGS) ×1 IMPLANT
GLOVE BIOGEL PI IND STRL 9 (GLOVE) ×1 IMPLANT
GLOVE BIOGEL PI INDICATOR 9 (GLOVE) ×1
GLOVE SURG ORTHO 9.0 STRL STRW (GLOVE) ×2 IMPLANT
GOWN STRL REUS W/ TWL LRG LVL3 (GOWN DISPOSABLE) IMPLANT
GOWN STRL REUS W/ TWL XL LVL3 (GOWN DISPOSABLE) ×3 IMPLANT
GOWN STRL REUS W/TWL LRG LVL3 (GOWN DISPOSABLE) ×1
GOWN STRL REUS W/TWL XL LVL3 (GOWN DISPOSABLE) ×2
GUIDEWIRE 1.6 6IN (WIRE) ×1 IMPLANT
KIT BASIN OR (CUSTOM PROCEDURE TRAY) ×2 IMPLANT
KIT TURNOVER KIT B (KITS) ×2 IMPLANT
MANIFOLD NEPTUNE II (INSTRUMENTS) ×2 IMPLANT
NS IRRIG 1000ML POUR BTL (IV SOLUTION) ×2 IMPLANT
PACK ORTHO EXTREMITY (CUSTOM PROCEDURE TRAY) ×2 IMPLANT
PAD ARMBOARD 7.5X6 YLW CONV (MISCELLANEOUS) ×4 IMPLANT
SCREW CANN SHT HDLS 4X50 (Screw) ×1 IMPLANT
STAPLER VISISTAT 35W (STAPLE) IMPLANT
SUCTION FRAZIER HANDLE 10FR (MISCELLANEOUS) ×1
SUCTION TUBE FRAZIER 10FR DISP (MISCELLANEOUS) ×1 IMPLANT
SUT ETHILON 2 0 PSLX (SUTURE) ×1 IMPLANT
SUT VIC AB 2-0 CT1 27 (SUTURE) ×1
SUT VIC AB 2-0 CT1 TAPERPNT 27 (SUTURE) ×1 IMPLANT
TOWEL GREEN STERILE (TOWEL DISPOSABLE) ×2 IMPLANT
TOWEL GREEN STERILE FF (TOWEL DISPOSABLE) ×2 IMPLANT
TUBE CONNECTING 12X1/4 (SUCTIONS) ×2 IMPLANT

## 2021-10-23 NOTE — Op Note (Signed)
10/23/2021  10:30 AM  PATIENT:  Alison Tucker    PRE-OPERATIVE DIAGNOSIS:  Non-Union Left Fibula Fracture  POST-OPERATIVE DIAGNOSIS:  Same  PROCEDURE:  OPEN REDUCTION INTERNAL FIXATION LEFT FIBULA  FRACTURE C arm fluoroscopy to verify reduction.  SURGEON:  Nadara Mustard, MD  PHYSICIAN ASSISTANT:None ANESTHESIA:   General  PREOPERATIVE INDICATIONS:  [name] is a  47 y.o. female with a diagnosis of Non-Union Left Fibula Fracture who failed conservative measures and elected for surgical management.    The risks benefits and alternatives were discussed with the patient preoperatively including but not limited to the risks of infection, bleeding, nerve injury, cardiopulmonary complications, the need for revision surgery, among others, and the patient was willing to proceed.  OPERATIVE IMPLANTS: 50 mm headless cannulated screw  @ENCIMAGES @  OPERATIVE FINDINGS: C-arm possibly verified reduction AP and lateral planes.  OPERATIVE PROCEDURE: Patient was brought the operating room and underwent a general anesthetic.  After adequate levels anesthesia obtained patient's left lower extremity was prepped using DuraPrep draped into a sterile field a timeout was called.  A lateral incision was made over the fibula this was carried sharply down to bone.  There was a fibrous nonunion of the lateral malleolar fracture this was debrided with a curette back to healthy viable bone.  The sclerotic edges were excised.  Guidewire was inserted from the distal fibula through the shaft C-arm possibly verified alignment and a 50 mm screw was used to stabilize the fracture.  See arthroscopy verified reduction.  The wound was irrigated with normal saline incision closed using 2-0 nylon a sterile dressing was applied patient was taken the PACU in stable condition.   DISCHARGE PLANNING:  Antibiotic duration: Preoperative antibiotics  Weightbearing: Touchdown weightbearing on the left  Pain medication:  Prescription for Vicodin  Dressing care/ Wound VAC: Dry dressing  Ambulatory devices: Crutches  Discharge to: Home.  Follow-up: In the office 1 week post operative.

## 2021-10-23 NOTE — Interval H&P Note (Signed)
History and Physical Interval Note:  10/23/2021 8:24 AM  Alison Tucker  has presented today for surgery, with the diagnosis of Non-Union Left Fibula Fracture.  The various methods of treatment have been discussed with the patient and family. After consideration of risks, benefits and other options for treatment, the patient has consented to  Procedure(s): OPEN REDUCTION INTERNAL FIXATION (ORIF) LEFT FIBULA FRACTURE (Left) as a surgical intervention.  The patient's history has been reviewed, patient examined, no change in status, stable for surgery.  I have reviewed the patient's chart and labs.  Questions were answered to the patient's satisfaction.     Alison Tucker

## 2021-10-23 NOTE — H&P (Addendum)
Alison Tucker is an 46 y.o. female.   Chief Complaint: Painful nonunion lateral malleolar fracture HPI: Patient is a 46 year old woman who sustained a nondisplaced Weber a fibular fracture left ankle initial injury in October 2022.  Patient has been using a bone stimulator and has about 1 week left on the bone stimulator.  She most recently has obtained an MRI scan of her left ankle.  Patient states she has pain and discomfort that comes and goes.  Past Medical History:  Diagnosis Date   Anxiety    Chicken pox    Depression    Family history of brain cancer 09/17/2017   Son, negative genetic screening   Family history of brain stem cancer 09/17/2017   Sister's daughter; neg genetic testing   IUD (intrauterine device) in place - mirena 2018 09/17/2017    Past Surgical History:  Procedure Laterality Date   AUGMENTATION MAMMAPLASTY Bilateral 2010   COSMETIC SURGERY  2010    Family History  Problem Relation Age of Onset   Arthritis Mother    Hyperlipidemia Mother    Hypertension Mother    Stroke Mother    Brain cancer Son 64       astrocytoma and glioblastoma   Arthritis Maternal Grandmother    Alcohol abuse Maternal Grandfather    Learning disabilities Daughter    Mental retardation Daughter    Seizures Daughter    Brain cancer Other        DIPG   Social History:  reports that she has quit smoking. She has never been exposed to tobacco smoke. She has never used smokeless tobacco. She reports current alcohol use of about 3.0 standard drinks of alcohol per week. She reports that she does not use drugs.  Allergies: No Known Allergies  No medications prior to admission.    No results found for this or any previous visit (from the past 48 hour(s)). No results found.  Review of Systems  All other systems reviewed and are negative.   There were no vitals taken for this visit. Physical Exam  Patient is alert, oriented, no adenopathy, well-dressed, normal affect, normal  respiratory effort. Examination patient has a strong palpable dorsalis pedis pulse she has good range of motion of the ankle subtalar joint the ankle is stable the syndesmosis is nontender.  Patient is point tender to palpation of the distal fibula.  Radiographs show no displacement review of the MRI scan shows a fibrous nonunion. Assessment/Plan Assessment: Closed nonunion left lateral malleolus fracture.  Plan: Due to failure of conservative therapy and persistent pain patient would like to proceed with surgical intervention.  We will plan for takedown of the fibrous nonunion open reduction internal fixation.  Risks and benefits were discussed including infection neurovascular injury persistent pain need for additional surgery.  Patient states she understands wished to proceed at this time.  Nadara Mustard, MD 10/23/2021, 6:35 AM

## 2021-10-23 NOTE — Anesthesia Procedure Notes (Addendum)
Procedure Name: LMA Insertion Date/Time: 10/23/2021 9:58 AM  Performed by: Audie Pinto, CRNAPre-anesthesia Checklist: Patient identified, Emergency Drugs available, Suction available and Patient being monitored Patient Re-evaluated:Patient Re-evaluated prior to induction Oxygen Delivery Method: Circle system utilized Preoxygenation: Pre-oxygenation with 100% oxygen Induction Type: IV induction Ventilation: Mask ventilation without difficulty LMA: LMA inserted LMA Size: 3.0 Number of attempts: 1 Tube secured with: Tape Dental Injury: Teeth and Oropharynx as per pre-operative assessment  Comments: Lauren Cozart, SRNA placed LMA under supervision.

## 2021-10-23 NOTE — Anesthesia Procedure Notes (Signed)
Anesthesia Regional Block: Popliteal block   Pre-Anesthetic Checklist: , timeout performed,  Correct Patient, Correct Site, Correct Laterality,  Correct Procedure, Correct Position, site marked,  Risks and benefits discussed,  Pre-op evaluation,  At surgeon's request and post-op pain management  Laterality: Left  Prep: Maximum Sterile Barrier Precautions used, chloraprep       Needles:  Injection technique: Single-shot  Needle Type: Echogenic Stimulator Needle     Needle Length: 9cm  Needle Gauge: 22     Additional Needles:   Procedures:,,,, ultrasound used (permanent image in chart),,    Narrative:  Start time: 10/23/2021 9:14 AM End time: 10/23/2021 9:16 AM Injection made incrementally with aspirations every 5 mL.  Performed by: Personally  Anesthesiologist: Kaylyn Layer, MD  Additional Notes: Risks, benefits, and alternative discussed. Patient gave consent for procedure. Patient prepped and draped in sterile fashion. Sedation administered, patient remains easily responsive to voice. Relevant anatomy identified with ultrasound guidance. Local anesthetic given in 5cc increments with no signs or symptoms of intravascular injection. No pain or paraesthesias with injection. Patient monitored throughout procedure with signs of LAST or immediate complications. Tolerated well. Ultrasound image placed in chart.  Alison Greenhouse, MD

## 2021-10-23 NOTE — Anesthesia Postprocedure Evaluation (Signed)
Anesthesia Post Note  Patient: Interior and spatial designer  Procedure(s) Performed: OPEN REDUCTION INTERNAL FIXATION LEFT FIBULA  FRACTURE (Left: Ankle)     Patient location during evaluation: PACU Anesthesia Type: Regional and General Level of consciousness: awake and alert Pain management: pain level controlled Vital Signs Assessment: post-procedure vital signs reviewed and stable Respiratory status: spontaneous breathing, nonlabored ventilation, respiratory function stable and patient connected to nasal cannula oxygen Cardiovascular status: blood pressure returned to baseline and stable Postop Assessment: no apparent nausea or vomiting Anesthetic complications: no   No notable events documented.  Last Vitals:  Vitals:   10/23/21 1100 10/23/21 1115  BP: (!) 93/58 99/61  Pulse: 69 69  Resp: 14 14  Temp:  37 C  SpO2: 96% 99%    Last Pain:  Vitals:   10/23/21 1115  TempSrc:   PainSc: 0-No pain                 Johnanna Bakke L Chaska Hagger

## 2021-10-23 NOTE — Anesthesia Procedure Notes (Signed)
Anesthesia Regional Block: Adductor canal block   Pre-Anesthetic Checklist: , timeout performed,  Correct Patient, Correct Site, Correct Laterality,  Correct Procedure, Correct Position, site marked,  Risks and benefits discussed,  Pre-op evaluation,  At surgeon's request and post-op pain management  Laterality: Left  Prep: Maximum Sterile Barrier Precautions used, chloraprep       Needles:  Injection technique: Single-shot  Needle Type: Echogenic Stimulator Needle     Needle Length: 9cm  Needle Gauge: 22     Additional Needles:   Procedures:,,,, ultrasound used (permanent image in chart),,    Narrative:  Start time: 10/23/2021 9:12 AM End time: 10/23/2021 9:14 AM Injection made incrementally with aspirations every 5 mL.  Performed by: Personally  Anesthesiologist: Kaylyn Layer, MD  Additional Notes: Risks, benefits, and alternative discussed. Patient gave consent for procedure. Patient prepped and draped in sterile fashion. Sedation administered, patient remains easily responsive to voice. Relevant anatomy identified with ultrasound guidance. Local anesthetic given in 5cc increments with no signs or symptoms of intravascular injection. No pain or paraesthesias with injection. Patient monitored throughout procedure with signs of LAST or immediate complications. Tolerated well. Ultrasound image placed in chart.  Alison Greenhouse, MD

## 2021-10-23 NOTE — Transfer of Care (Signed)
Immediate Anesthesia Transfer of Care Note  Patient: Alison Tucker  Procedure(s) Performed: OPEN REDUCTION INTERNAL FIXATION LEFT FIBULA  FRACTURE (Left: Ankle)  Patient Location: PACU  Anesthesia Type:General and Regional  Level of Consciousness: alert  and drowsy  Airway & Oxygen Therapy: Patient Spontanous Breathing and Patient connected to face mask oxygen  Post-op Assessment: Report given to RN and Post -op Vital signs reviewed and stable  Post vital signs: Reviewed and stable  Last Vitals:  Vitals Value Taken Time  BP 100/63 10/23/21 1047  Temp    Pulse 77 10/23/21 1049  Resp 12 10/23/21 1049  SpO2 99 % 10/23/21 1049  Vitals shown include unvalidated device data.  Last Pain:  Vitals:   10/23/21 0725  TempSrc: Oral         Complications: No notable events documented.

## 2021-10-24 ENCOUNTER — Encounter (HOSPITAL_COMMUNITY): Payer: Self-pay | Admitting: Orthopedic Surgery

## 2021-10-28 ENCOUNTER — Encounter: Payer: Self-pay | Admitting: Orthopedic Surgery

## 2021-10-30 ENCOUNTER — Encounter: Payer: Self-pay | Admitting: Family

## 2021-10-30 ENCOUNTER — Ambulatory Visit (INDEPENDENT_AMBULATORY_CARE_PROVIDER_SITE_OTHER): Payer: Managed Care, Other (non HMO)

## 2021-10-30 ENCOUNTER — Ambulatory Visit (INDEPENDENT_AMBULATORY_CARE_PROVIDER_SITE_OTHER): Payer: Managed Care, Other (non HMO) | Admitting: Family

## 2021-10-30 DIAGNOSIS — Z9889 Other specified postprocedural states: Secondary | ICD-10-CM

## 2021-10-30 DIAGNOSIS — Z8781 Personal history of (healed) traumatic fracture: Secondary | ICD-10-CM

## 2021-10-30 NOTE — Progress Notes (Signed)
HPI The patient is a 46 year old woman who presents today status post open reduction internal fixation of a left fibular fracture on July 19. States she could not tolerate the Norco it made her too drowsy Ortho Exam On examination of the left ankle her incision is approximated sutures there is no gaping or drainage there is minimal edema Visit Diagnoses: No diagnosis found.   Plan: Begin daily Dial soap cleansing dry dressings.  We will call in a prescription for Tylenol 3

## 2021-10-30 NOTE — Progress Notes (Deleted)
Post-Op Visit Note   Patient: Alison Tucker           Date of Birth: 1975/07/12           MRN: [id number] Visit Date: 10/30/2021 PCP: Willow Ora, MD  Chief Complaint:  Chief Complaint  Patient presents with   Left Ankle - Routine Post Op    10/23/21 ORIF left fib Fx    HPI:  HPI The patient is a 46 year old woman who presents today status post open reduction internal fixation of a left fibular fracture on July 19. Ortho Exam On examination of the left ankle her incision is clean dry and intact there is minimal edema no erythema no drainage  Visit Diagnoses: No diagnosis found.  Plan: Continue nonweightbearing.  She is developing a heel cord contracture encouraged her to resume her cam walker.  Nonweightbearing.  Follow-up in 2 weeks with radiographs  Follow-Up Instructions: No follow-ups on file.   Imaging: No results found.  Orders:  No orders of the defined types were placed in this encounter.  No orders of the defined types were placed in this encounter.    PMFS History: Patient Active Problem List   Diagnosis Date Noted   Closed nondisplaced fracture of lateral malleolus of left fibula    Cervical high risk HPV (human papillomavirus) test positive 08/02/2021   Allergic rhinitis due to animal (cat) (dog) hair and dander 07/26/2021   Allergic rhinitis due to pollen 07/26/2021   Chronic allergic conjunctivitis 07/26/2021   Adjustment disorder with anxiety 01/17/2021   Low back pain, episodic 01/17/2021   IUD (intrauterine device) in place - mirena 2018 09/17/2017   Family history of brain cancer 09/17/2017   Family history of brain stem cancer 09/17/2017   IBS (irritable bowel syndrome) 03/10/2013   Past Medical History:  Diagnosis Date   Anxiety    Chicken pox    Depression    Family history of brain cancer 09/17/2017   Son, negative genetic screening   Family history of brain stem cancer 09/17/2017   Sister's daughter; neg genetic testing   IUD  (intrauterine device) in place - mirena 2018 09/17/2017    Family History  Problem Relation Age of Onset   Arthritis Mother    Hyperlipidemia Mother    Hypertension Mother    Stroke Mother    Brain cancer Son 15       astrocytoma and glioblastoma   Arthritis Maternal Grandmother    Alcohol abuse Maternal Grandfather    Learning disabilities Daughter    Mental retardation Daughter    Seizures Daughter    Brain cancer Other        DIPG    Past Surgical History:  Procedure Laterality Date   AUGMENTATION MAMMAPLASTY Bilateral 2010   COSMETIC SURGERY  2010   ORIF ANKLE FRACTURE Left 10/23/2021   Procedure: OPEN REDUCTION INTERNAL FIXATION LEFT FIBULA  FRACTURE;  Surgeon: Nadara Mustard, MD;  Location: [facility] OR;  Service: Orthopedics;  Laterality: Left;   Social History   Occupational History   Not on file  Tobacco Use   Smoking status: Former    Passive exposure: Never   Smokeless tobacco: Never  Vaping Use   Vaping Use: Some days   Substances: Nicotine, Flavoring  Substance and Sexual Activity   Alcohol use: Yes    Alcohol/week: 3.0 standard drinks of alcohol    Types: 3 Standard drinks or equivalent per week   Drug use: Never   Sexual activity:  Yes

## 2021-11-01 ENCOUNTER — Encounter: Payer: Managed Care, Other (non HMO) | Admitting: Family

## 2021-11-01 MED ORDER — ACETAMINOPHEN-CODEINE 300-30 MG PO TABS: 1.0000 | ORAL_TABLET | Freq: Four times a day (QID) | ORAL | 0 refills | Status: DC | PRN

## 2021-11-14 ENCOUNTER — Ambulatory Visit (INDEPENDENT_AMBULATORY_CARE_PROVIDER_SITE_OTHER): Payer: Managed Care, Other (non HMO) | Admitting: Orthopedic Surgery

## 2021-11-14 ENCOUNTER — Encounter: Payer: Self-pay | Admitting: Orthopedic Surgery

## 2021-11-14 DIAGNOSIS — S8265XK Nondisplaced fracture of lateral malleolus of left fibula, subsequent encounter for closed fracture with nonunion: Secondary | ICD-10-CM

## 2021-11-14 NOTE — Progress Notes (Signed)
Office Visit Note   Patient: Alison Tucker           Date of Birth: 10/13/1975           MRN: [id number] Visit Date: 11/14/2021              Requested by: Willow Ora, MD [address] [place],  [place] [address] PCP: Willow Ora, MD  Chief Complaint  Patient presents with   Left Ankle - Routine Post Op    10/23/21 ORIF ankle fracture      HPI: Patient is a 46 year old woman who presents 3 weeks status post intramedullary fixation for a nonunion Weber a left fibular fracture.  Assessment & Plan: Visit Diagnoses:  1. 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  Three-view radiographs of the left ankle at follow-up.  Follow-Up Instructions: Return in about 3 weeks (around 12/05/2021).   Ortho Exam  Patient is alert, oriented, no adenopathy, well-dressed, normal affect, normal respiratory effort. Examination the incision is well-healed there is no redness no cellulitis.  No pain with passive range of motion of the ankle.  Imaging: No results found. No images are attached to the encounter.  Labs: Lab Results  Component Value Date   ESRSEDRATE 13 01/17/2021   CRP <1.0 01/17/2021     Lab Results  Component Value Date   ALBUMIN 4.3 07/26/2021   ALBUMIN 4.6 02/11/2021   ALBUMIN 4.2 09/20/2018    No results found for: "MG" No results found for: "VD25OH"  No results found for: "PREALBUMIN"    Latest Ref Rng & Units 07/26/2021    9:41 AM 02/11/2021    2:04 PM 09/20/2018    3:27 PM  CBC EXTENDED  WBC 4.0 - 10.5 K/uL 5.9  7.0  6.2   RBC 3.87 - 5.11 Mil/uL 3.93  4.27  4.02   Hemoglobin 12.0 - 15.0 g/dL 29.5  18.8  41.6   HCT 36.0 - 46.0 % 38.5  41.0  39.5   Platelets 150.0 - 400.0 K/uL 290.0  323  268.0   NEUT# 1.4 - 7.7 K/uL 3.0   3.5   Lymph# 0.7 - 4.0 K/uL 2.1   1.8       There is no height or weight on file to calculate BMI.  Orders:  No orders of the defined types were placed in this encounter.  No orders of the defined types were placed in this encounter.    Procedures: No procedures performed  Clinical Data: No additional findings.  ROS:  All other systems negative, except as noted in the HPI. Review of Systems  Objective: Vital Signs: There were no vitals taken for this visit.  Specialty Comments:  No specialty comments available.  PMFS History: Patient Active Problem List   Diagnosis Date Noted   Closed nondisplaced fracture of lateral malleolus of left fibula    Cervical high risk HPV (human papillomavirus) test positive 08/02/2021   Allergic rhinitis due to animal (cat) (dog) hair and dander 07/26/2021   Allergic rhinitis due to pollen 07/26/2021   Chronic allergic conjunctivitis 07/26/2021   Adjustment disorder with anxiety 01/17/2021   Low back pain, episodic 01/17/2021   IUD (intrauterine device) in place - mirena 2018 09/17/2017   Family history of brain cancer 09/17/2017   Family history of brain stem  cancer 09/17/2017   IBS (irritable bowel syndrome) 03/10/2013   Past Medical History:  Diagnosis Date   Anxiety    Chicken pox    Depression    Family history of brain cancer 09/17/2017   Son, negative genetic screening   Family history of brain stem cancer 09/17/2017   Sister's daughter; neg genetic testing   IUD (intrauterine device) in place - mirena 2018 09/17/2017    Family History  Problem Relation Age of Onset   Arthritis Mother    Hyperlipidemia Mother    Hypertension Mother    Stroke Mother    Brain cancer Son 15       astrocytoma and glioblastoma   Arthritis Maternal Grandmother    Alcohol abuse Maternal Grandfather    Learning disabilities Daughter    Mental retardation Daughter    Seizures Daughter    Brain cancer Other        DIPG    Past Surgical History:  Procedure Laterality Date    AUGMENTATION MAMMAPLASTY Bilateral 2010   COSMETIC SURGERY  2010   ORIF ANKLE FRACTURE Left 10/23/2021   Procedure: OPEN REDUCTION INTERNAL FIXATION LEFT FIBULA  FRACTURE;  Surgeon: Nadara Mustard, MD;  Location: [facility] OR;  Service: Orthopedics;  Laterality: Left;   Social History   Occupational History   Not on file  Tobacco Use   Smoking status: Former    Passive exposure: Never   Smokeless tobacco: Never  Vaping Use   Vaping Use: Some days   Substances: Nicotine, Flavoring  Substance and Sexual Activity   Alcohol use: Yes    Alcohol/week: 3.0 standard drinks of alcohol    Types: 3 Standard drinks or equivalent per week   Drug use: Never   Sexual activity: Yes

## 2021-12-05 ENCOUNTER — Encounter: Payer: Self-pay | Admitting: Orthopedic Surgery

## 2021-12-05 ENCOUNTER — Ambulatory Visit (INDEPENDENT_AMBULATORY_CARE_PROVIDER_SITE_OTHER): Payer: Managed Care, Other (non HMO)

## 2021-12-05 ENCOUNTER — Ambulatory Visit: Payer: Managed Care, Other (non HMO) | Admitting: Orthopedic Surgery

## 2021-12-05 DIAGNOSIS — Z9889 Other specified postprocedural states: Secondary | ICD-10-CM | POA: Diagnosis not present

## 2021-12-05 DIAGNOSIS — Z8781 Personal history of (healed) traumatic fracture: Secondary | ICD-10-CM | POA: Diagnosis not present

## 2021-12-05 NOTE — Progress Notes (Signed)
Office Visit Note   Patient: Alison Tucker           Date of Birth: 1976-02-06           MRN: [id number] Visit Date: 12/05/2021              Requested by: Willow Ora, MD [address] [place],  [place] [address] PCP: Willow Ora, MD  Chief Complaint  Patient presents with   Left Ankle - Routine Post Op    10/23/21 ORIF ankle fracture      HPI: Patient is a 46 year old woman who is 6 weeks status post intramedullary fixation for Weber a left fibular fracture.  Patient states she has some pain occasionally laterally.  Assessment & Plan: Visit Diagnoses:  1. S/P ORIF (open reduction internal fixation) fracture     Plan: Patient was given instructions for toe raise strengthening.  She will continue with scar massage.  She will wean out of the fracture boot as she feels comfortable.  Follow-Up Instructions: Return in about 4 weeks (around 01/02/2022).   Ortho Exam  Patient is alert, oriented, no adenopathy, well-dressed, normal affect, normal respiratory effort. Examination the incision is well-healed there is no tenderness to palpation over the fibula.  There is mild swelling.  Imaging: XR Ankle Complete Left  Result Date: 12/05/2021 Three-view radiographs of the left ankle shows stable internal fixation of the intramedullary screw for the fibular fracture.  The mortise is congruent.  The bone is healed well.  No images are attached to the encounter.  Labs: Lab Results  Component Value Date   ESRSEDRATE 13 01/17/2021   CRP <1.0 01/17/2021     Lab Results  Component Value Date   ALBUMIN 4.3 07/26/2021   ALBUMIN 4.6 02/11/2021   ALBUMIN 4.2 09/20/2018    No results found for: "MG" No results found for: "VD25OH"  No results found for: "PREALBUMIN"    Latest Ref Rng & Units 07/26/2021    9:41 AM 02/11/2021    2:04 PM 09/20/2018    3:27 PM  CBC EXTENDED  WBC 4.0 - 10.5 K/uL 5.9  7.0  6.2   RBC 3.87 - 5.11 Mil/uL 3.93  4.27  4.02   Hemoglobin 12.0  - 15.0 g/dL 57.3  22.0  25.4   HCT 36.0 - 46.0 % 38.5  41.0  39.5   Platelets 150.0 - 400.0 K/uL 290.0  323  268.0   NEUT# 1.4 - 7.7 K/uL 3.0   3.5   Lymph# 0.7 - 4.0 K/uL 2.1   1.8      There is no height or weight on file to calculate BMI.  Orders:  Orders Placed This Encounter  Procedures   XR Ankle Complete Left   No orders of the defined types were placed in this encounter.    Procedures: No procedures performed  Clinical Data: No additional findings.  ROS:  All other systems negative, except as noted in the HPI. Review of Systems  Objective: Vital Signs: There were no vitals taken for this visit.  Specialty Comments:  No specialty comments available.  PMFS History: Patient Active Problem List   Diagnosis Date Noted   Closed nondisplaced fracture of lateral malleolus of left fibula    Cervical high risk HPV (human papillomavirus) test positive 08/02/2021   Allergic rhinitis due to animal (cat) (dog) hair and dander 07/26/2021   Allergic rhinitis due to pollen 07/26/2021   Chronic allergic conjunctivitis 07/26/2021   Adjustment disorder with  anxiety 01/17/2021   Low back pain, episodic 01/17/2021   IUD (intrauterine device) in place - mirena 2018 09/17/2017   Family history of brain cancer 09/17/2017   Family history of brain stem cancer 09/17/2017   IBS (irritable bowel syndrome) 03/10/2013   Past Medical History:  Diagnosis Date   Anxiety    Chicken pox    Depression    Family history of brain cancer 09/17/2017   Son, negative genetic screening   Family history of brain stem cancer 09/17/2017   Sister's daughter; neg genetic testing   IUD (intrauterine device) in place - mirena 2018 09/17/2017    Family History  Problem Relation Age of Onset   Arthritis Mother    Hyperlipidemia Mother    Hypertension Mother    Stroke Mother    Brain cancer Son 15       astrocytoma and glioblastoma   Arthritis Maternal Grandmother    Alcohol abuse Maternal  Grandfather    Learning disabilities Daughter    Mental retardation Daughter    Seizures Daughter    Brain cancer Other        DIPG    Past Surgical History:  Procedure Laterality Date   AUGMENTATION MAMMAPLASTY Bilateral 2010   COSMETIC SURGERY  2010   ORIF ANKLE FRACTURE Left 10/23/2021   Procedure: OPEN REDUCTION INTERNAL FIXATION LEFT FIBULA  FRACTURE;  Surgeon: Nadara Mustard, MD;  Location: [facility] OR;  Service: Orthopedics;  Laterality: Left;   Social History   Occupational History   Not on file  Tobacco Use   Smoking status: Former    Passive exposure: Never   Smokeless tobacco: Never  Vaping Use   Vaping Use: Some days   Substances: Nicotine, Flavoring  Substance and Sexual Activity   Alcohol use: Yes    Alcohol/week: 3.0 standard drinks of alcohol    Types: 3 Standard drinks or equivalent per week   Drug use: Never   Sexual activity: Yes

## 2021-12-17 ENCOUNTER — Encounter: Payer: Self-pay | Admitting: Family Medicine

## 2021-12-19 ENCOUNTER — Ambulatory Visit: Payer: Managed Care, Other (non HMO) | Admitting: Family Medicine

## 2021-12-19 ENCOUNTER — Encounter: Payer: Self-pay | Admitting: Family Medicine

## 2021-12-19 VITALS — BP 110/82 | HR 98 | Temp 98.2°F | Ht 61.0 in | Wt 155.2 lb

## 2021-12-19 DIAGNOSIS — R400 Somnolence: Secondary | ICD-10-CM | POA: Diagnosis not present

## 2021-12-19 DIAGNOSIS — R5383 Other fatigue: Secondary | ICD-10-CM | POA: Diagnosis not present

## 2021-12-19 DIAGNOSIS — R0683 Snoring: Secondary | ICD-10-CM

## 2021-12-19 DIAGNOSIS — R6882 Decreased libido: Secondary | ICD-10-CM | POA: Diagnosis not present

## 2021-12-19 DIAGNOSIS — N951 Menopausal and female climacteric states: Secondary | ICD-10-CM | POA: Diagnosis not present

## 2021-12-19 DIAGNOSIS — G4700 Insomnia, unspecified: Secondary | ICD-10-CM

## 2021-12-19 LAB — CBC WITH DIFFERENTIAL/PLATELET
Basophils Absolute: 0 10*3/uL (ref 0.0–0.1)
Basophils Relative: 0.7 % (ref 0.0–3.0)
Eosinophils Absolute: 0.1 10*3/uL (ref 0.0–0.7)
Eosinophils Relative: 2 % (ref 0.0–5.0)
HCT: 39.7 % (ref 36.0–46.0)
Hemoglobin: 13.5 g/dL (ref 12.0–15.0)
Lymphocytes Relative: 34.6 % (ref 12.0–46.0)
Lymphs Abs: 1.8 10*3/uL (ref 0.7–4.0)
MCHC: 34.1 g/dL (ref 30.0–36.0)
MCV: 97 fl (ref 78.0–100.0)
Monocytes Absolute: 0.4 10*3/uL (ref 0.1–1.0)
Monocytes Relative: 8.7 % (ref 3.0–12.0)
Neutro Abs: 2.7 10*3/uL (ref 1.4–7.7)
Neutrophils Relative %: 54 % (ref 43.0–77.0)
Platelets: 287 10*3/uL (ref 150.0–400.0)
RBC: 4.09 Mil/uL (ref 3.87–5.11)
RDW: 13.4 % (ref 11.5–15.5)
WBC: 5.1 10*3/uL (ref 4.0–10.5)

## 2021-12-19 LAB — TSH: TSH: 1.61 u[IU]/mL (ref 0.35–5.50)

## 2021-12-19 MED ORDER — ZOLPIDEM TARTRATE 10 MG PO TABS: 5.0000 mg | ORAL_TABLET | Freq: Every evening | ORAL | 2 refills | Status: AC | PRN

## 2021-12-19 NOTE — Progress Notes (Signed)
Subjective  CC:  Chief Complaint  Patient presents with   Concerns   Hypothyroidism    Pt would like to discuss thyroids. Pt stated that she has been fatigued, and low energy.    HPI: Alison Tucker is a 46 y.o. female who presents to the office today to address the problems listed above in the chief complaint. 46 year old female with Mirena IUD for birth control presents due to worsening fatigue.  Describes a 6-week history of increased problems falling asleep, staying asleep and being sleepy during the day.  She does not wake feeling rested.  She does snore but does not have any witnessed apnea episodes or orthopnea.  She admits to hot flashes but no irritability.  Libido is very low.  Tired during the workday and often needs a nap at lunchtime.  Then when is time to go to bed, in spite of a good presleep routine, she is wide-awake.  She admits that she has always had problems falling asleep however things are becoming harder.  She is never dealt with daytime fatigue.  No skin or hair changes.  No weight changes although it is harder for her to lose weight.  She has been less active due to a slow to heal left ankle fracture.  No mood concerns.  No stress concerns.  She keeps her busy schedule working and taking care of her children.  She wonders if her thyroid is a problem.  She has used melatonin and some over-the-counter sleep aids but tends to feel groggy in the morning.  She sleeps for about 6 to 7 hours per night although when she is able to sleep in on the weekends, she will sleep much longer.  Assessment  1. Daytime somnolence   2. Other fatigue   3. Hot flushes, perimenopausal   4. Low libido   5. Insomnia, unspecified type   6. Snoring      Plan  Fatigue: Likely multifactorial, discussed sleep hygiene, possible sleep apnea, perimenopausal symptoms, chronic onset insomnia and chronic sleep deprivation.  We will check TSH and CBC.  Discussed sleep hygiene.  Start Ambien: Risk and  benefits discussed, proper use discussed.  Recommend healthy diet and increase exercise as tolerated.  Refer to sleep pulmonology to evaluate need for sleep study. Perimenopausal: Continue IUD.  Consider black cohosh or over-the-counter soy products to help with hot flashes.  Follow up: As needed. Visit date not found  Orders Placed This Encounter  Procedures   CBC with Differential/Platelet   TSH   Ambulatory referral to Pulmonology   Meds ordered this encounter  Medications   zolpidem (AMBIEN) 10 MG tablet    Sig: Take 0.5-1 tablets (5-10 mg total) by mouth at bedtime as needed for sleep.    Dispense:  30 tablet    Refill:  2      I reviewed the patients updated PMH, FH, and SocHx.    Patient Active Problem List   Diagnosis Date Noted   Closed nondisplaced fracture of lateral malleolus of left fibula    Cervical high risk HPV (human papillomavirus) test positive 08/02/2021   Allergic rhinitis due to animal (cat) (dog) hair and dander 07/26/2021   Allergic rhinitis due to pollen 07/26/2021   Chronic allergic conjunctivitis 07/26/2021   Adjustment disorder with anxiety 01/17/2021   Low back pain, episodic 01/17/2021   IUD (intrauterine device) in place - mirena 2018 09/17/2017   Family history of brain cancer 09/17/2017   Family history of brain stem  cancer 09/17/2017   IBS (irritable bowel syndrome) 03/10/2013   Current Meds  Medication Sig   acetaminophen-codeine (TYLENOL #3) 300-30 MG tablet Take 1 tablet by mouth every 6 (six) hours as needed for moderate pain.   azelastine (ASTELIN) 0.1 % nasal spray Place 1 spray into both nostrils 2 (two) times daily.   clonazePAM (KLONOPIN) 0.5 MG tablet Take 1 tablet (0.5 mg total) by mouth daily as needed for anxiety.   fluticasone (FLONASE SENSIMIST) 27.5 MCG/SPRAY nasal spray Place 1 spray into the nose daily as needed for allergies.   HYDROcodone-acetaminophen (NORCO/VICODIN) 5-325 MG tablet Take 1 tablet by mouth every 4  (four) hours as needed.   ibuprofen (ADVIL,MOTRIN) 200 MG tablet Take 600 mg by mouth every 6 (six) hours as needed for headache, moderate pain or mild pain.   levocetirizine (XYZAL) 5 MG tablet Take 5 mg by mouth every evening.   levonorgestrel (MIRENA) 20 MCG/24HR IUD 1 each by Intrauterine route once.   zolpidem (AMBIEN) 10 MG tablet Take 0.5-1 tablets (5-10 mg total) by mouth at bedtime as needed for sleep.    Allergies: Patient has No Known Allergies. Family History: Patient family history includes Alcohol abuse in her maternal grandfather; Arthritis in her maternal grandmother and mother; Brain cancer in an other family member; Brain cancer (age of onset: 53) in her son; Hyperlipidemia in her mother; Hypertension in her mother; Learning disabilities in her daughter; Mental retardation in her daughter; Seizures in her daughter; Stroke in her mother. Social History:  Patient  reports that she has quit smoking. She has never been exposed to tobacco smoke. She has never used smokeless tobacco. She reports current alcohol use of about 3.0 standard drinks of alcohol per week. She reports that she does not use drugs.  Review of Systems: Constitutional: Negative for fever malaise or anorexia Cardiovascular: negative for chest pain Respiratory: negative for SOB or persistent cough Gastrointestinal: negative for abdominal pain  Objective  Vitals: BP 110/82   Pulse 98   Temp 98.2 F (36.8 C)   Ht 5\' 1"  (1.549 m)   Wt 155 lb 3.2 oz (70.4 kg)   SpO2 94%   BMI 29.32 kg/m  General: no acute distress , A&Ox3  Commons side effects, risks, benefits, and alternatives for medications and treatment plan prescribed today were discussed, and the patient expressed understanding of the given instructions. Patient is instructed to call or message via MyChart if he/she has any questions or concerns regarding our treatment plan. No barriers to understanding were identified. We discussed Red Flag symptoms  and signs in detail. Patient expressed understanding regarding what to do in case of urgent or emergency type symptoms.  Medication list was reconciled, printed and provided to the patient in AVS. Patient instructions and summary information was reviewed with the patient as documented in the AVS. This note was prepared with assistance of Dragon voice recognition software. Occasional wrong-word or sound-a-like substitutions may have occurred due to the inherent limitations of voice recognition software  This visit occurred during the SARS-CoV-2 public health emergency.  Safety protocols were in place, including screening questions prior to the visit, additional usage of staff PPE, and extensive cleaning of exam room while observing appropriate contact time as indicated for disinfecting solutions.

## 2021-12-19 NOTE — Patient Instructions (Signed)
Please follow up if symptoms do not improve or as needed.    I will release your lab results to you on your MyChart account with further instructions. You may see the results before I do, but when I review them I will send you a message with my report or have my assistant call you if things need to be discussed. Please reply to my message with any questions. Thank you!   I have referred you to the sleep specialists.  Try the [place]. You maytry black cohosh or otc soy products to help the night hot flushes.   Insomnia Insomnia is a sleep disorder that makes it difficult to fall asleep or stay asleep. Insomnia can cause fatigue, low energy, difficulty concentrating, mood swings, and poor performance at work or school. There are three different ways to classify insomnia: Difficulty falling asleep. Difficulty staying asleep. Waking up too early in the morning. Any type of insomnia can be long-term (chronic) or short-term (acute). Both are common. Short-term insomnia usually lasts for 3 months or less. Chronic insomnia occurs at least three times a week for longer than 3 months. What are the causes? Insomnia may be caused by another condition, situation, or substance, such as: Having certain mental health conditions, such as anxiety and depression. Using caffeine, alcohol, tobacco, or drugs. Having gastrointestinal conditions, such as gastroesophageal reflux disease (GERD). Having certain medical conditions. These include: Asthma. Alzheimer's disease. Stroke. Chronic pain. An overactive thyroid gland (hyperthyroidism). Other sleep disorders, such as restless legs syndrome and sleep apnea. Menopause. Sometimes, the cause of insomnia may not be known. What increases the risk? Risk factors for insomnia include: Gender. Females are affected more often than males. Age. Insomnia is more common as people get older. Stress and certain medical and mental health conditions. Lack of  exercise. Having an irregular work schedule. This may include working night shifts and traveling between different time zones. What are the signs or symptoms? If you have insomnia, the main symptom is having trouble falling asleep or having trouble staying asleep. This may lead to other symptoms, such as: Feeling tired or having low energy. Feeling nervous about going to sleep. Not feeling rested in the morning. Having trouble concentrating. Feeling irritable, anxious, or depressed. How is this diagnosed? This condition may be diagnosed based on: Your symptoms and medical history. Your health care provider may ask about: Your sleep habits. Any medical conditions you have. Your mental health. A physical exam. How is this treated? Treatment for insomnia depends on the cause. Treatment may focus on treating an underlying condition that is causing the insomnia. Treatment may also include: Medicines to help you sleep. Counseling or therapy. Lifestyle adjustments to help you sleep better. Follow these instructions at home: Eating and drinking  Limit or avoid alcohol, caffeinated beverages, and products that contain nicotine and tobacco, especially close to bedtime. These can disrupt your sleep. Do not eat a large meal or eat spicy foods right before bedtime. This can lead to digestive discomfort that can make it hard for you to sleep. Sleep habits  Keep a sleep diary to help you and your health care provider figure out what could be causing your insomnia. Write down: When you sleep. When you wake up during the night. How well you sleep and how rested you feel the next day. Any side effects of medicines you are taking. What you eat and drink. Make your bedroom a dark, comfortable place where it is easy to fall asleep. Put up  shades or blackout curtains to block light from outside. Use a white noise machine to block noise. Keep the temperature cool. Limit screen use before bedtime. This  includes: Not watching TV. Not using your smartphone, tablet, or computer. Stick to a routine that includes going to bed and waking up at the same times every day and night. This can help you fall asleep faster. Consider making a quiet activity, such as reading, part of your nighttime routine. Try to avoid taking naps during the day so that you sleep better at night. Get out of bed if you are still awake after 15 minutes of trying to sleep. Keep the lights down, but try reading or doing a quiet activity. When you feel sleepy, go back to bed. General instructions Take over-the-counter and prescription medicines only as told by your health care provider. Exercise regularly as told by your health care provider. However, avoid exercising in the hours right before bedtime. Use relaxation techniques to manage stress. Ask your health care provider to suggest some techniques that may work well for you. These may include: Breathing exercises. Routines to release muscle tension. Visualizing peaceful scenes. Make sure that you drive carefully. Do not drive if you feel very sleepy. Keep all follow-up visits. This is important. Contact a health care provider if: You are tired throughout the day. You have trouble in your daily routine due to sleepiness. You continue to have sleep problems, or your sleep problems get worse. Get help right away if: You have thoughts about hurting yourself or someone else. Get help right away if you feel like you may hurt yourself or others, or have thoughts about taking your own life. Go to your nearest emergency room or: Call 911. Call the National Suicide Prevention Lifeline at 662-375-0293 or 988. This is open 24 hours a day. Text the Crisis Text Line at 850-711-2675. Summary Insomnia is a sleep disorder that makes it difficult to fall asleep or stay asleep. Insomnia can be long-term (chronic) or short-term (acute). Treatment for insomnia depends on the cause. Treatment  may focus on treating an underlying condition that is causing the insomnia. Keep a sleep diary to help you and your health care provider figure out what could be causing your insomnia. This information is not intended to replace advice given to you by your health care provider. Make sure you discuss any questions you have with your health care provider. Document Revised: 03/04/2021 Document Reviewed: 03/04/2021 Elsevier Patient Education  2023 [employer].

## 2021-12-30 ENCOUNTER — Encounter: Payer: Self-pay | Admitting: Adult Health

## 2021-12-30 ENCOUNTER — Ambulatory Visit (INDEPENDENT_AMBULATORY_CARE_PROVIDER_SITE_OTHER): Payer: Managed Care, Other (non HMO) | Admitting: Adult Health

## 2021-12-30 VITALS — BP 110/70 | HR 89 | Temp 98.3°F | Ht 61.0 in | Wt 157.4 lb

## 2021-12-30 DIAGNOSIS — R0683 Snoring: Secondary | ICD-10-CM | POA: Diagnosis not present

## 2021-12-30 NOTE — Progress Notes (Signed)
@Patient  ID: Alison Tucker, female    DOB: 1975/12/06, 46 y.o.   MRN: [id number]  Chief Complaint  Patient presents with   sleep consult    Referring provider: Leamon Arnt, MD  HPI: 46 year old female seen for sleep consult December 30, 2021 for daytime sleepiness, restless sleep and snoring  TEST/EVENTS :   12/30/2021 Sleep consult  Patient presents for sleep consult.  Kindly referred by her primary care provider Dr. Jonni Sanger.  Patient complains that she wakes up multiple times throughout the night.  Has trouble going to sleep and staying asleep. Feels tired and fatigued throughout the day. Can sit and fall asleep after eating lunch.  Has restless sleep and snoring.  Patient typically goes to bed about 1030 to 11:30 PM.  Takes up to 45 minutes to go to sleep.  Is up 3 times at night.  And gets up about 6:45 AM.  Weight is up about 20 pounds over the last 2 years current weight is at 157 pounds with a BMI of 29.  She is had no previous sleep study.  No history of congestive heart failure or stroke.  No symptoms suspicious for cataplexy or sleep paralysis.  Caffeine intake 1 cup of coffee in am.  . Takes Ambien to help with sleep, works well. Feels better during daytime after taking. Tired of being tired all the time.  Epworth score is 7 out of 24.  Typically gets sleepy if she sits down to read or watch TV and in the afternoon hours. Rarely taking Klonopin for anxiety .  Reads before going to bed. Tries to limit screen time around bedtime.  Not as active since foot surgery . Typically walks daily.   Medical history significant for chronic allergies, IBS, anxiety   Surgical history October 23, 2021 ORIF ankle fracture left.  Breast augmentation 2010  Social history; patient is married.  Lives at home with her husband and 2 children.  Works at [employer].  Patient care coordinator Former smoker.  Social alcohol.   Family history positive for asthma, rheumatoid arthritis and  Son died has brain cancer, Seizure disorder -daughter.    No Known Allergies  Immunization History  Administered Date(s) Administered   Influenza Inj Mdck Quad With Preservative 12/20/2020   Influenza, High Dose Seasonal PF 03/09/2019   Influenza, Quadrivalent, Recombinant, Inj, Pf 01/18/2018   Influenza,inj,Quad PF,6+ Mos 11/22/2021   MMR 08/18/2013, 08/18/2013   PFIZER(Purple Top)SARS-COV-2 Vaccination 06/19/2019, 07/11/2019, 08/18/2019, 03/10/2020   Pfizer Covid-19 Vaccine Bivalent Booster 57yr & up 12/26/2020   Tdap 01/02/2009, 08/18/2013   Varicella 08/18/2013    Past Medical History:  Diagnosis Date   Anxiety    Chicken pox    Depression    Family history of brain cancer 09/17/2017   Son, negative genetic screening   Family history of brain stem cancer 09/17/2017   Sister's daughter; neg genetic testing   IUD (intrauterine device) in place - mirena 2018 09/17/2017    Tobacco History: Social History   Tobacco Use  Smoking Status Former   Packs/day: 0.10   Years: 7.00   Total pack years: 0.70   Types: Cigarettes   Quit date: 2013   Years since quitting: 10.7   Passive exposure: Never  Smokeless Tobacco Never   Counseling given: Not Answered   Outpatient Medications Prior to Visit  Medication Sig Dispense Refill   acetaminophen-codeine (TYLENOL #3) 300-30 MG tablet Take 1 tablet by mouth every 6 (six) hours as needed for  moderate pain. 30 tablet 0   azelastine (ASTELIN) 0.1 % nasal spray Place 1 spray into both nostrils 2 (two) times daily.     clonazePAM (KLONOPIN) 0.5 MG tablet Take 1 tablet (0.5 mg total) by mouth daily as needed for anxiety. 20 tablet 0   fluticasone (FLONASE SENSIMIST) 27.5 MCG/SPRAY nasal spray Place 1 spray into the nose daily as needed for allergies.     HYDROcodone-acetaminophen (NORCO/VICODIN) 5-325 MG tablet Take 1 tablet by mouth every 4 (four) hours as needed. 30 tablet 0   ibuprofen (ADVIL,MOTRIN) 200 MG tablet Take 600 mg by  mouth every 6 (six) hours as needed for headache, moderate pain or mild pain.     levocetirizine (XYZAL) 5 MG tablet Take 5 mg by mouth every evening.     levonorgestrel (MIRENA) 20 MCG/24HR IUD 1 each by Intrauterine route once.     zolpidem (AMBIEN) 10 MG tablet Take 0.5-1 tablets (5-10 mg total) by mouth at bedtime as needed for sleep. 30 tablet 2   No facility-administered medications prior to visit.     Review of Systems:   Constitutional:   No  weight loss, night sweats,  Fevers, chills,  +fatigue, or  lassitude.  HEENT:   No headaches,  Difficulty swallowing,  Tooth/dental problems, or  Sore throat,                No sneezing, itching, ear ache, nasal congestion, post nasal drip,   CV:  No chest pain,  Orthopnea, PND, swelling in lower extremities, anasarca, dizziness, palpitations, syncope.   GI  No heartburn, indigestion, abdominal pain, nausea, vomiting, diarrhea, change in bowel habits, loss of appetite, bloody stools.   Resp: No shortness of breath with exertion or at rest.  No excess mucus, no productive cough,  No non-productive cough,  No coughing up of blood.  No change in color of mucus.  No wheezing.  No chest wall deformity  Skin: no rash or lesions.  GU: no dysuria, change in color of urine, no urgency or frequency.  No flank pain, no hematuria   MS:  No joint pain or swelling.  No decreased range of motion.  No back pain.    Physical Exam  BP 110/70 (BP Location: Left Arm, Patient Position: Sitting, Cuff Size: Normal)   Pulse 89   Temp 98.3 F (36.8 C) (Oral)   Ht 5' 1"  (1.549 m)   Wt 157 lb 6.4 oz (71.4 kg)   SpO2 99% Comment: RA  BMI 29.74 kg/m   GEN: A/Ox3; pleasant , NAD, well nourished    HEENT:  [place]/AT,  NOSE-clear, THROAT-clear, no lesions, no postnasal drip or exudate noted.  Class III-IV MP airway  NECK:  Supple w/ fair ROM; no JVD; normal carotid impulses w/o bruits; no thyromegaly or nodules palpated; no lymphadenopathy.    RESP  Clear   P & A; w/o, wheezes/ rales/ or rhonchi. no accessory muscle use, no dullness to percussion  CARD:  RRR, no m/r/g, no peripheral edema, pulses intact, no cyanosis or clubbing.  GI:   Soft & nt; nml bowel sounds; no organomegaly or masses detected.   Musco: Warm bil, no deformities or joint swelling noted.   Neuro: alert, no focal deficits noted.    Skin: Warm, no lesions or rashes    Lab Results:      BNP No results found for: "BNP"  ProBNP No results found for: "PROBNP"  Imaging:        No data  to display          No results found for: "NITRICOXIDE"      Assessment & Plan:   Snoring Snoring, restless sleep, insomnia, daytime sleepiness all concerning for underlying sleep apnea.  Set patient up for home sleep study.  Patient education given  - discussed how weight can impact sleep and risk for sleep disordered breathing - discussed options to assist with weight loss: combination of diet modification, cardiovascular and strength training exercises   - had an extensive discussion regarding the adverse health consequences related to untreated sleep disordered breathing - specifically discussed the risks for hypertension, coronary artery disease, cardiac dysrhythmias, cerebrovascular disease, and diabetes - lifestyle modification discussed   - discussed how sleep disruption can increase risk of accidents, particularly when driving - safe driving practices were discussed   Plan  Patient Instructions  Set up for home sleep study  Healthy sleep regimen  Work on healthy weight loss  Follow up in 6 weeks to discuss results and treatment plan.         Rexene Edison, NP 12/30/2021

## 2021-12-30 NOTE — Patient Instructions (Signed)
Set up for home sleep study  Healthy sleep regimen  Work on healthy weight loss  Follow up in 6 weeks to discuss results and treatment plan.

## 2021-12-30 NOTE — Assessment & Plan Note (Signed)
Snoring, restless sleep, insomnia, daytime sleepiness all concerning for underlying sleep apnea.  Set patient up for home sleep study.  Patient education given  - discussed how weight can impact sleep and risk for sleep disordered breathing - discussed options to assist with weight loss: combination of diet modification, cardiovascular and strength training exercises   - had an extensive discussion regarding the adverse health consequences related to untreated sleep disordered breathing - specifically discussed the risks for hypertension, coronary artery disease, cardiac dysrhythmias, cerebrovascular disease, and diabetes - lifestyle modification discussed   - discussed how sleep disruption can increase risk of accidents, particularly when driving - safe driving practices were discussed   Plan  Patient Instructions  Set up for home sleep study  Healthy sleep regimen  Work on healthy weight loss  Follow up in 6 weeks to discuss results and treatment plan.

## 2021-12-31 NOTE — Progress Notes (Signed)
Reviewed and agree with assessment/plan.   Adley Castello, MD [facility] 12/31/2021, 8:29 AM Pager:  [phone]  

## 2022-01-02 ENCOUNTER — Ambulatory Visit (INDEPENDENT_AMBULATORY_CARE_PROVIDER_SITE_OTHER): Payer: Managed Care, Other (non HMO) | Admitting: Orthopedic Surgery

## 2022-01-02 DIAGNOSIS — Z9889 Other specified postprocedural states: Secondary | ICD-10-CM

## 2022-01-02 DIAGNOSIS — S8265XK Nondisplaced fracture of lateral malleolus of left fibula, subsequent encounter for closed fracture with nonunion: Secondary | ICD-10-CM

## 2022-01-02 DIAGNOSIS — Z8781 Personal history of (healed) traumatic fracture: Secondary | ICD-10-CM

## 2022-01-03 ENCOUNTER — Institutional Professional Consult (permissible substitution): Payer: Managed Care, Other (non HMO) | Admitting: Adult Health

## 2022-01-06 ENCOUNTER — Encounter: Payer: Self-pay | Admitting: Orthopedic Surgery

## 2022-01-06 NOTE — Progress Notes (Signed)
Office Visit Note   Patient: Alison Tucker           Date of Birth: [birth year]           MRN: [id number] Visit Date: 01/02/2022              Requested by: Leamon Arnt, [place],  [place] [id number] PCP: Leamon Arnt, MD  Chief Complaint  Patient presents with   Left Ankle - Routine Post Op    10/23/21 ORIF ankle fracture      HPI: Patient is a 46 year old woman who is 2 months status post open reduction internal fixation left fibular fracture.  She has been doing toe raises scar massage and weaning out of the fracture boot.  Patient states she has a pinching burning pain on the dorsum of her foot at times.  Assessment & Plan: Visit Diagnoses:  1. Closed nondisplaced fracture of left lateral malleolus with nonunion   2. S/P ORIF (open reduction internal fixation) fracture     Plan: Patient was given exercise instructions dorsiflexion stretching exercises.  Follow-Up Instructions: Return if symptoms worsen or fail to improve.   Ortho Exam  Patient is alert, oriented, no adenopathy, well-dressed, normal affect, normal respiratory effort. Examination the incision is well-healed she has dorsiflexion to neutral there is no pain with range of motion of the ankle she does have pain of the dorsum of her foot from Achilles tightness.  Imaging: No results found. No images are attached to the encounter.  Labs: Lab Results  Component Value Date   ESRSEDRATE 13 01/17/2021   CRP <1.0 01/17/2021     Lab Results  Component Value Date   ALBUMIN 4.3 07/26/2021   ALBUMIN 4.6 02/11/2021   ALBUMIN 4.2 09/20/2018    No results found for: "MG" No results found for: "VD25OH"  No results found for: "PREALBUMIN"    Latest Ref Rng & Units 12/19/2021   11:53 AM 07/26/2021    9:41 AM 02/11/2021    2:04 PM  CBC EXTENDED  WBC 4.0 - 10.5 K/uL 5.1  5.9  7.0   RBC 3.87 - 5.11 Mil/uL 4.09  3.93  4.27   Hemoglobin 12.0 - 15.0 g/dL 13.5  13.1  13.7   HCT 36.0 -  46.0 % 39.7  38.5  41.0   Platelets 150.0 - 400.0 K/uL 287.0  290.0  323   NEUT# 1.4 - 7.7 K/uL 2.7  3.0    Lymph# 0.7 - 4.0 K/uL 1.8  2.1       There is no height or weight on file to calculate BMI.  Orders:  No orders of the defined types were placed in this encounter.  No orders of the defined types were placed in this encounter.    Procedures: No procedures performed  Clinical Data: No additional findings.  ROS:  All other systems negative, except as noted in the HPI. Review of Systems  Objective: Vital Signs: There were no vitals taken for this visit.  Specialty Comments:  No specialty comments available.  PMFS History: Patient Active Problem List   Diagnosis Date Noted   Snoring 12/30/2021   Closed nondisplaced fracture of lateral malleolus of left fibula    Cervical high risk HPV (human papillomavirus) test positive 08/02/2021   Allergic rhinitis due to animal (cat) (dog) hair and dander 07/26/2021   Allergic rhinitis due to pollen 07/26/2021   Chronic allergic conjunctivitis 07/26/2021   Adjustment disorder with anxiety 01/17/2021  Low back pain, episodic 01/17/2021   IUD (intrauterine device) in place - mirena 2018 09/17/2017   Family history of brain cancer 09/17/2017   Family history of brain stem cancer 09/17/2017   IBS (irritable bowel syndrome) 03/10/2013   Past Medical History:  Diagnosis Date   Anxiety    Chicken pox    Depression    Family history of brain cancer 09/17/2017   Son, negative genetic screening   Family history of brain stem cancer 09/17/2017   Sister's daughter; neg genetic testing   IUD (intrauterine device) in place - mirena 2018 09/17/2017    Family History  Problem Relation Age of Onset   Arthritis Mother    Hyperlipidemia Mother    Hypertension Mother    Stroke Mother    Brain cancer Son 15       astrocytoma and glioblastoma   Arthritis Maternal Grandmother    Alcohol abuse Maternal Grandfather    Learning  disabilities Daughter    Mental retardation Daughter    Seizures Daughter    Brain cancer Other        DIPG    Past Surgical History:  Procedure Laterality Date   AUGMENTATION MAMMAPLASTY Bilateral 2010   COSMETIC SURGERY  2010   ORIF ANKLE FRACTURE Left 10/23/2021   Procedure: OPEN REDUCTION INTERNAL FIXATION LEFT FIBULA  FRACTURE;  Surgeon: Nadara Mustard, MD;  Location: [facility] OR;  Service: Orthopedics;  Laterality: Left;   Social History   Occupational History   Not on file  Tobacco Use   Smoking status: Former    Packs/day: 0.10    Years: 7.00    Total pack years: 0.70    Types: Cigarettes    Quit date: 2013    Years since quitting: 10.7    Passive exposure: Never   Smokeless tobacco: Never  Vaping Use   Vaping Use: Some days   Substances: Nicotine, Flavoring  Substance and Sexual Activity   Alcohol use: Yes    Alcohol/week: 3.0 standard drinks of alcohol    Types: 3 Standard drinks or equivalent per week   Drug use: Never   Sexual activity: Yes

## 2022-02-13 ENCOUNTER — Ambulatory Visit: Payer: Managed Care, Other (non HMO) | Admitting: Adult Health

## 2022-02-21 ENCOUNTER — Other Ambulatory Visit: Payer: Self-pay | Admitting: Family Medicine

## 2022-02-21 ENCOUNTER — Ambulatory Visit
Admission: RE | Admit: 2022-02-21 | Discharge: 2022-02-21 | Disposition: A | Discharge status: Home / Self Care | Payer: Managed Care, Other (non HMO) | Source: Ambulatory Visit | Attending: Family Medicine | Admitting: Family Medicine

## 2022-02-21 DIAGNOSIS — R928 Other abnormal and inconclusive findings on diagnostic imaging of breast: Secondary | ICD-10-CM

## 2022-03-26 ENCOUNTER — Ambulatory Visit: Payer: Managed Care, Other (non HMO)

## 2022-03-26 DIAGNOSIS — R0683 Snoring: Secondary | ICD-10-CM | POA: Diagnosis not present

## 2022-04-13 ENCOUNTER — Ambulatory Visit (HOSPITAL_COMMUNITY)
Admission: EM | Admit: 2022-04-13 | Discharge: 2022-04-13 | Disposition: A | Discharge status: Home / Self Care | Payer: Managed Care, Other (non HMO) | Attending: Physician Assistant | Admitting: Physician Assistant

## 2022-04-13 ENCOUNTER — Encounter (HOSPITAL_COMMUNITY): Payer: Self-pay

## 2022-04-13 DIAGNOSIS — Z1152 Encounter for screening for COVID-19: Secondary | ICD-10-CM

## 2022-04-13 DIAGNOSIS — J069 Acute upper respiratory infection, unspecified: Secondary | ICD-10-CM | POA: Insufficient documentation

## 2022-04-13 DIAGNOSIS — R051 Acute cough: Secondary | ICD-10-CM | POA: Diagnosis present

## 2022-04-13 DIAGNOSIS — J209 Acute bronchitis, unspecified: Secondary | ICD-10-CM

## 2022-04-13 MED ORDER — ALBUTEROL SULFATE HFA 108 (90 BASE) MCG/ACT IN AERS: 2.0000 | INHALATION_SPRAY | Freq: Four times a day (QID) | RESPIRATORY_TRACT | 0 refills | Status: DC | PRN

## 2022-04-13 MED ORDER — HYDROCODONE BIT-HOMATROP MBR 5-1.5 MG/5ML PO SOLN: 5.0000 mL | Freq: Four times a day (QID) | ORAL | 0 refills | Status: DC | PRN

## 2022-04-13 MED ORDER — SPACER/AERO-HOLDING CHAMBERS DEVI: 1.0000 [IU] | Freq: Three times a day (TID) | 0 refills | Status: DC

## 2022-04-13 NOTE — Discharge Instructions (Signed)
Advised take ibuprofen or Tylenol as needed for fever or body discomfort. Advised to use the albuterol inhaler with spacer, 2 puffs every 6-8 hours on a regular basis to help decrease wheezing and cough. Advised to use the Hycodan cough syrup, 1 to 2 teaspoons every 6-8 hours as needed for cough and chest congestion.  (Use this medicine with caution as it does cause drowsiness)  Advised to follow-up PCP or return to urgent care if symptoms fail to improve over the next several days.

## 2022-04-13 NOTE — ED Provider Notes (Signed)
[facility]    CSN: [id number] Arrival date & time: 04/13/22  1022      History   Chief Complaint Chief Complaint  Patient presents with   Cough   Nasal Congestion    HPI Alison Tucker is a 47 y.o. female.   78-year-old female presents with cough and chest congestion.  Patient indicates that she just returned from [place] a week ago.  She relates that when in [place] they were staying at her mother's house, and at the beach and was not around any individuals that were sick that she is aware of.  Patient indicates that she has been vaccinated against COVID and flu this year.  Patient relates for the past 4 to 5 days she has been having cough, chest congestion without production.  She relates having coughing exacerbations and spasms to where she has difficulty stopping the cough.  She indicates that along with a cough she has some mild shortness of breath and wheezing.  She also indicates having some mild upper respiratory congestion with rhinitis and postnasal drip with intermittent sore throat which resolved several days ago.  Patient indicates she has had some mild intermittent fever and chills.  Patient without nausea, vomiting, fever, body aches or pains.  Patient indicates she is tolerating fluids well.  Patient indicates she is a non-smoker   Cough Associated symptoms: rhinorrhea, shortness of breath and wheezing     Past Medical History:  Diagnosis Date   Anxiety    Chicken pox    Depression    Family history of brain cancer 09/17/2017   Son, negative genetic screening   Family history of brain stem cancer 09/17/2017   Sister's daughter; neg genetic testing   IUD (intrauterine device) in place - mirena 2018 09/17/2017    Patient Active Problem List   Diagnosis Date Noted   Snoring 12/30/2021   Closed nondisplaced fracture of lateral malleolus of left fibula    Cervical high risk HPV (human papillomavirus) test positive 08/02/2021   Allergic  rhinitis due to animal (cat) (dog) hair and dander 07/26/2021   Allergic rhinitis due to pollen 07/26/2021   Chronic allergic conjunctivitis 07/26/2021   Adjustment disorder with anxiety 01/17/2021   Low back pain, episodic 01/17/2021   IUD (intrauterine device) in place - mirena 2018 09/17/2017   Family history of brain cancer 09/17/2017   Family history of brain stem cancer 09/17/2017   IBS (irritable bowel syndrome) 03/10/2013    Past Surgical History:  Procedure Laterality Date   AUGMENTATION MAMMAPLASTY Bilateral 2010   COSMETIC SURGERY  2010   ORIF ANKLE FRACTURE Left 10/23/2021   Procedure: OPEN REDUCTION INTERNAL FIXATION LEFT FIBULA  FRACTURE;  Surgeon: Newt Minion, MD;  Location: [facility];  Service: Orthopedics;  Laterality: Left;    OB History     Gravida      Para      Term      Preterm      AB      Living  3      SAB      IAB      Ectopic      Multiple      Live Births               Home Medications    Prior to Admission medications   Medication Sig Start Date End Date Taking? Authorizing Provider  albuterol (VENTOLIN HFA) 108 (90 Base) MCG/ACT inhaler Inhale 2 puffs into the  lungs every 6 (six) hours as needed for wheezing or shortness of breath. 04/13/22  Yes Ellsworth Lennox, PA-C  azelastine (ASTELIN) 0.1 % nasal spray Place 1 spray into both nostrils 2 (two) times daily.   Yes [provider]  clonazePAM (KLONOPIN) 0.5 MG tablet Take 1 tablet (0.5 mg total) by mouth daily as needed for anxiety. 01/17/21  Yes Willow Ora, MD  DULoxetine (CYMBALTA) 30 MG capsule Take 30 mg by mouth daily. 03/26/22  Yes [provider]  HYDROcodone bit-homatropine (HYCODAN) 5-1.5 MG/5ML syrup Take 5 mLs by mouth every 6 (six) hours as needed for cough. 04/13/22  Yes Ellsworth Lennox, PA-C  levocetirizine (XYZAL) 5 MG tablet Take 5 mg by mouth every evening.   Yes [provider]  Spacer/Aero-Holding Chambers DEVI 1 Units by Does not apply  route in the morning, at noon, and at bedtime. 04/13/22  Yes Ellsworth Lennox, PA-C  acetaminophen-codeine (TYLENOL #3) 300-30 MG tablet Take 1 tablet by mouth every 6 (six) hours as needed for moderate pain. 11/01/21   Adonis Huguenin, NP  fluticasone (FLONASE SENSIMIST) 27.5 MCG/SPRAY nasal spray Place 1 spray into the nose daily as needed for allergies.    [provider]  HYDROcodone-acetaminophen (NORCO/VICODIN) 5-325 MG tablet Take 1 tablet by mouth every 4 (four) hours as needed. 10/23/21   Nadara Mustard, MD  ibuprofen (ADVIL,MOTRIN) 200 MG tablet Take 600 mg by mouth every 6 (six) hours as needed for headache, moderate pain or mild pain.    [provider]  levonorgestrel (MIRENA) 20 MCG/24HR IUD 1 each by Intrauterine route once.    [provider]  zolpidem (AMBIEN) 10 MG tablet Take 0.5-1 tablets (5-10 mg total) by mouth at bedtime as needed for sleep. 12/19/21   Willow Ora, MD    Family History Family History  Problem Relation Age of Onset   Arthritis Mother    Hyperlipidemia Mother    Hypertension Mother    Stroke Mother    Brain cancer Son 38       astrocytoma and glioblastoma   Arthritis Maternal Grandmother    Alcohol abuse Maternal Grandfather    Learning disabilities Daughter    Mental retardation Daughter    Seizures Daughter    Brain cancer Other        DIPG    Social History Social History   Tobacco Use   Smoking status: Former    Packs/day: 0.10    Years: 7.00    Total pack years: 0.70    Types: Cigarettes    Quit date: 2013    Years since quitting: 11.0    Passive exposure: Never   Smokeless tobacco: Never  Vaping Use   Vaping Use: Some days   Substances: Nicotine, Flavoring  Substance Use Topics   Alcohol use: Yes    Alcohol/week: 3.0 standard drinks of alcohol    Types: 3 Standard drinks or equivalent per week   Drug use: Never     Allergies   Patient has no known allergies.   Review of Systems Review of Systems   HENT:  Positive for postnasal drip and rhinorrhea.   Respiratory:  Positive for cough, shortness of breath and wheezing.      Physical Exam Triage Vital Signs ED Triage Vitals  Enc Vitals Group     BP 04/13/22 1139 (!) 140/89     Pulse Rate 04/13/22 1139 (!) 115     Resp 04/13/22 1139 16  Temp 04/13/22 1139 98.9 F (37.2 C)     Temp Source 04/13/22 1139 Oral     SpO2 04/13/22 1139 98 %     Weight 04/13/22 1139 145 lb (65.8 kg)     Height 04/13/22 1139 5\' 1"  (1.549 m)     Head Circumference --      Peak Flow --      Pain Score 04/13/22 1137 5     Pain Loc --      Pain Edu? --      Excl. in GC? --    No data found.  Updated Vital Signs BP (!) 140/89 (BP Location: Left Arm)   Pulse (!) 115   Temp 98.9 F (37.2 C) (Oral)   Resp 16   Ht 5\' 1"  (1.549 m)   Wt 145 lb (65.8 kg)   LMP  (LMP Unknown)   SpO2 98%   BMI 27.40 kg/m   Visual Acuity Right Eye Distance:   Left Eye Distance:   Bilateral Distance:    Right Eye Near:   Left Eye Near:    Bilateral Near:     Physical Exam Constitutional:      Appearance: Normal appearance.  HENT:     Right Ear: Tympanic membrane and ear canal normal.     Left Ear: Tympanic membrane and ear canal normal.     Mouth/Throat:     Mouth: Mucous membranes are moist.     Pharynx: Oropharynx is clear.  Cardiovascular:     Rate and Rhythm: Normal rate and regular rhythm.     Heart sounds: Normal heart sounds.  Pulmonary:     Effort: Pulmonary effort is normal.     Breath sounds: Normal breath sounds and air entry. No wheezing, rhonchi or rales.  Lymphadenopathy:     Cervical: No cervical adenopathy.  Neurological:     Mental Status: She is alert.      UC Treatments / Results  Labs (all labs ordered are listed, but only abnormal results are displayed) Labs Reviewed  SARS CORONAVIRUS 2 (TAT 6-24 HRS)    EKG   Radiology No results found.  Procedures Procedures (including critical care time)  Medications  Ordered in UC Medications - No data to display  Initial Impression / Assessment and Plan / UC Course  I have reviewed the triage vital signs and the nursing notes.  Pertinent labs & imaging results that were available during my care of the patient were reviewed by me and considered in my medical decision making (see chart for details).    Plan: 1.  The upper respiratory infection will be treated with the following: A.  Hycodan cough syrup, 1 to 2 teaspoons every 6-8 hours as needed for cough or congestion. 2.  The acute cough will be treated with the following: A.  Hycodan cough syrup, 1 to 2 teaspoons every 6-8 hours as needed for cough and congestion. 3.  The acute bronchitis we treated with the following: A.  Albuterol inhaler with spacer, 2 puffs every 6 hours on a regular basis to help control wheezing and shortness of breath. 4.  Screening for COVID-19 will be treated with the following: A.  Treatment may be considered depending on the results of the COVID test. 5.  Patient advised follow-up PCP or return to urgent care as needed. Final Clinical Impressions(s) / UC Diagnoses   Final diagnoses:  Viral upper respiratory tract infection  Acute cough  Acute bronchitis, unspecified organism  Encounter for screening  for COVID-19     Discharge Instructions      Advised take ibuprofen or Tylenol as needed for fever or body discomfort. Advised to use the albuterol inhaler with spacer, 2 puffs every 6-8 hours on a regular basis to help decrease wheezing and cough. Advised to use the Hycodan cough syrup, 1 to 2 teaspoons every 6-8 hours as needed for cough and chest congestion.  (Use this medicine with caution as it does cause drowsiness)  Advised to follow-up PCP or return to urgent care if symptoms fail to improve over the next several days.    ED Prescriptions     Medication Sig Dispense Auth. Provider   HYDROcodone bit-homatropine (HYCODAN) 5-1.5 MG/5ML syrup Take 5 mLs by  mouth every 6 (six) hours as needed for cough. 120 mL Ellsworth Lennox, PA-C   albuterol (VENTOLIN HFA) 108 (90 Base) MCG/ACT inhaler Inhale 2 puffs into the lungs every 6 (six) hours as needed for wheezing or shortness of breath. 8 g Ellsworth Lennox, PA-C   Spacer/Aero-Holding Chambers DEVI 1 Units by Does not apply route in the morning, at noon, and at bedtime. 1 Units Ellsworth Lennox, PA-C      I have reviewed the PDMP during this encounter.   Ellsworth Lennox, PA-C 04/13/22 1204

## 2022-04-13 NOTE — ED Triage Notes (Signed)
Chief Complaint: cough, nasal congestion. Cough is dry with chest pain.   Onset: Wednesday night   Prescriptions or OTC medications tried: Yes- otc cold and flu meds     with no relief  Sick exposure: No  New foods, medications, or products: No  Recent Travel: Yes- [place]

## 2022-04-14 LAB — SARS CORONAVIRUS 2 (TAT 6-24 HRS): SARS Coronavirus 2: POSITIVE — AB

## 2022-04-19 ENCOUNTER — Telehealth: Payer: Self-pay | Admitting: Pulmonary Disease

## 2022-04-19 DIAGNOSIS — R0683 Snoring: Secondary | ICD-10-CM

## 2022-04-19 NOTE — Telephone Encounter (Signed)
Call patient  Sleep study result  Date of study: 03/26/2022  Impression: Negative study for significant sleep disordered breathing No significant oxygen desaturations  Recommendation:  Consider in lab study for definitive diagnosis of possible sleep disordered breathing if clinical symptoms remain concerning  A negative home sleep study is reassuring, this will suggest absence of significant sleep disordered breathing  Sleep position optimization by encouraging sleep in a lateral position, elevating the head of the bed by about 30 degrees may help noted snoring  Encourage regular exercise and weight loss efforts  Caution against driving when sleepy & against medications with sedative side effects.

## 2022-04-20 NOTE — Telephone Encounter (Signed)
Thanks has ov on 04/22/22 to discuss

## 2022-04-22 ENCOUNTER — Ambulatory Visit: Payer: Managed Care, Other (non HMO) | Admitting: Adult Health

## 2022-05-02 ENCOUNTER — Ambulatory Visit: Payer: Managed Care, Other (non HMO) | Admitting: Adult Health

## 2022-05-02 ENCOUNTER — Encounter: Payer: Self-pay | Admitting: Adult Health

## 2022-05-02 VITALS — BP 110/76 | HR 85 | Ht 61.0 in | Wt 146.4 lb

## 2022-05-02 DIAGNOSIS — R0683 Snoring: Secondary | ICD-10-CM

## 2022-05-02 NOTE — Patient Instructions (Signed)
Healthy sleep regimen  Follow up as needed  Call back if you change mind on oral appliance referral

## 2022-05-02 NOTE — Progress Notes (Signed)
@Patient  ID: , female    DOB: 30-Sep-1975, 47 y.o.   MRN: [id number]  Chief Complaint  Patient presents with   Follow-up    Referring provider: [id number], MD  HPI: 47 year old female seen for sleep consult December 30, 2021 for snoring, daytime sleepiness with home study showing no significant sleep apnea  TEST/EVENTS :  HST 03/26/2022-Negative study for significant sleep disordered breathing No significant oxygen desaturations  05/02/2022 Follow up : Snoring  Patient returns for 63-month follow-up.  Patient was seen last visit for sleep consult for snoring and daytime sleepiness.  She was set up for home sleep study that was completed March 26, 2022 that showed no significant sleep apnea.  AHI was 3.4/hour.  Had no significant oxygen desaturations. We discussed her sleep study results in detail.  Went over referral to orthodontics for possible dental appliance for snoring.  Patient wants to hold off this time does not feel like her snoring is that bad.  She was recently started on Cymbalta which is helping with some chronic pain in shoes.  Does feel that she is not quite as tired during the daytime.  We discussed healthy sleep regimen     No Known Allergies  Immunization History  Administered Date(s) Administered   Influenza Inj Mdck Quad With Preservative 12/20/2020   Influenza, High Dose Seasonal PF 03/09/2019   Influenza, Quadrivalent, Recombinant, Inj, Pf 01/18/2018   Influenza,inj,Quad PF,6+ Mos 11/22/2021   MMR 08/18/2013, 08/18/2013   PFIZER Comirnaty(Gray Top)Covid-19 Tri-Sucrose Vaccine 06/19/2019, 07/11/2019, 03/10/2020   PFIZER(Purple Top)SARS-COV-2 Vaccination 06/19/2019, 07/11/2019, 08/18/2019, 03/10/2020   Pfizer Covid-19 Vaccine Bivalent Booster 63yrs & up 12/26/2020, 12/21/2021   Tdap 01/02/2009, 08/18/2013   Varicella 08/18/2013    Past Medical History:  Diagnosis Date   Anxiety    Chicken pox    Depression    Family history of  brain cancer 09/17/2017   Son, negative genetic screening   Family history of brain stem cancer 09/17/2017   Sister's daughter; neg genetic testing   IUD (intrauterine device) in place - mirena 2018 09/17/2017    Tobacco History: Social History   Tobacco Use  Smoking Status Former   Packs/day: 0.10   Years: 7.00   Total pack years: 0.70   Types: Cigarettes   Quit date: 2013   Years since quitting: 11.0   Passive exposure: Never  Smokeless Tobacco Never   Counseling given: Not Answered   Outpatient Medications Prior to Visit  Medication Sig Dispense Refill   albuterol (VENTOLIN HFA) 108 (90 Base) MCG/ACT inhaler Inhale 2 puffs into the lungs every 6 (six) hours as needed for wheezing or shortness of breath. 8 g 0   azelastine (ASTELIN) 0.1 % nasal spray Place 1 spray into both nostrils 2 (two) times daily.     levocetirizine (XYZAL) 5 MG tablet Take 5 mg by mouth every evening.     Spacer/Aero-Holding Chambers DEVI 1 Units by Does not apply route in the morning, at noon, and at bedtime. 1 Units 0   clonazePAM (KLONOPIN) 0.5 MG tablet Take 1 tablet (0.5 mg total) by mouth daily as needed for anxiety. 20 tablet 0   DULoxetine (CYMBALTA) 30 MG capsule Take 30 mg by mouth daily.     fluticasone (FLONASE SENSIMIST) 27.5 MCG/SPRAY nasal spray Place 1 spray into the nose daily as needed for allergies.     HYDROcodone bit-homatropine (HYCODAN) 5-1.5 MG/5ML syrup Take 5 mLs by mouth every 6 (six) hours as needed for cough.  120 mL 0   ibuprofen (ADVIL,MOTRIN) 200 MG tablet Take 600 mg by mouth every 6 (six) hours as needed for headache, moderate pain or mild pain.     levonorgestrel (MIRENA) 20 MCG/24HR IUD 1 each by Intrauterine route once.     zolpidem (AMBIEN) 10 MG tablet Take 0.5-1 tablets (5-10 mg total) by mouth at bedtime as needed for sleep. 30 tablet 2   acetaminophen-codeine (TYLENOL #3) 300-30 MG tablet Take 1 tablet by mouth every 6 (six) hours as needed for moderate pain. 30  tablet 0   HYDROcodone-acetaminophen (NORCO/VICODIN) 5-325 MG tablet Take 1 tablet by mouth every 4 (four) hours as needed. 30 tablet 0   No facility-administered medications prior to visit.     Review of Systems:   Constitutional:   No  weight loss, night sweats,  Fevers, chills,  +fatigue, or  lassitude.  HEENT:   No headaches,  Difficulty swallowing,  Tooth/dental problems, or  Sore throat,                No sneezing, itching, ear ache, nasal congestion, post nasal drip,   CV:  No chest pain,  Orthopnea, PND, swelling in lower extremities, anasarca, dizziness, palpitations, syncope.   GI  No heartburn, indigestion, abdominal pain, nausea, vomiting, diarrhea, change in bowel habits, loss of appetite, bloody stools.   Resp: No shortness of breath with exertion or at rest.  No excess mucus, no productive cough,  No non-productive cough,  No coughing up of blood.  No change in color of mucus.  No wheezing.  No chest wall deformity  Skin: no rash or lesions.  GU: no dysuria, change in color of urine, no urgency or frequency.  No flank pain, no hematuria   MS:  No joint pain or swelling.  No decreased range of motion.  No back pain.    Physical Exam  BP 110/76 (BP Location: Left Arm)   Pulse 85   Ht 5\' 1"  (1.549 m)   Wt 146 lb 6.4 oz (66.4 kg)   LMP  (LMP Unknown)   SpO2 97%   BMI 27.66 kg/m   GEN: A/Ox3; pleasant , NAD, well nourished    HEENT:  [place]/AT,  EACs-clear, THROAT-clear, no lesions, no postnasal drip or exudate noted.  Class III MP airway  NECK:  Supple w/ fair ROM; no JVD; normal carotid impulses w/o bruits; no thyromegaly or nodules palpated; no lymphadenopathy.    RESP  Clear  P & A; w/o, wheezes/ rales/ or rhonchi. no accessory muscle use, no dullness to percussion  CARD:  RRR, no m/r/g, no peripheral edema, pulses intact, no cyanosis or clubbing.  GI:   Soft & nt; nml bowel sounds; no organomegaly or masses detected.   Musco: Warm bil, no deformities or  joint swelling noted.   Neuro: alert, no focal deficits noted.    Skin: Warm, no lesions or rashes    Lab Results:  CBC    BNP No results found for: "BNP"  ProBNP No results found for: "PROBNP"  Imaging: No results found.        No data to display          No results found for: "NITRICOXIDE"      Assessment & Plan:   Snoring Snoring and daytime sleepiness.  Sleep study shows no significant sleep apnea.  Patient does not feel like her symptom burden is that severe.  Hold off on in lab study.  Does not want referral to  orthodontics at present time for oral appliance.  Healthy sleep regimen discussed.  Positional sleep discussed.  Plan  Patient Instructions  Healthy sleep regimen  Follow up as needed  Call back if you change mind on oral appliance referral       Rexene Edison, NP 05/02/2022

## 2022-05-02 NOTE — Assessment & Plan Note (Signed)
Snoring and daytime sleepiness.  Sleep study shows no significant sleep apnea.  Patient does not feel like her symptom burden is that severe.  Hold off on in lab study.  Does not want referral to orthodontics at present time for oral appliance.  Healthy sleep regimen discussed.  Positional sleep discussed.  Plan  Patient Instructions  Healthy sleep regimen  Follow up as needed  Call back if you change mind on oral appliance referral

## 2022-05-02 NOTE — Progress Notes (Signed)
Reviewed and agree with assessment/plan.   Chesley Mires, MD [facility] Pulmonary/Critical Care 05/02/2022, 4:51 PM Pager:  [phone]

## 2022-06-22 DIAGNOSIS — M5416 Radiculopathy, lumbar region: Secondary | ICD-10-CM | POA: Insufficient documentation

## 2022-07-18 ENCOUNTER — Other Ambulatory Visit: Payer: Self-pay

## 2022-07-18 DIAGNOSIS — R1084 Generalized abdominal pain: Secondary | ICD-10-CM | POA: Diagnosis not present

## 2022-07-18 DIAGNOSIS — R109 Unspecified abdominal pain: Secondary | ICD-10-CM | POA: Diagnosis present

## 2022-07-18 DIAGNOSIS — R110 Nausea: Secondary | ICD-10-CM | POA: Diagnosis not present

## 2022-07-18 LAB — CBC
HCT: 38.1 % (ref 36.0–46.0)
Hemoglobin: 13.1 g/dL (ref 12.0–15.0)
MCH: 32.5 pg (ref 26.0–34.0)
MCHC: 34.4 g/dL (ref 30.0–36.0)
MCV: 94.5 fL (ref 80.0–100.0)
Platelets: 314 10*3/uL (ref 150–400)
RBC: 4.03 MIL/uL (ref 3.87–5.11)
RDW: 13.2 % (ref 11.5–15.5)
WBC: 10.5 10*3/uL (ref 4.0–10.5)
nRBC: 0 % (ref 0.0–0.2)

## 2022-07-18 NOTE — ED Triage Notes (Signed)
Reports lower abd pain with onset around 2230. Also reports nausea. Tearful at triage.

## 2022-07-19 ENCOUNTER — Emergency Department (HOSPITAL_BASED_OUTPATIENT_CLINIC_OR_DEPARTMENT_OTHER)
Admission: EM | Admit: 2022-07-19 | Discharge: 2022-07-19 | Disposition: A | Discharge status: Home / Self Care | Payer: Managed Care, Other (non HMO) | Attending: Emergency Medicine | Admitting: Emergency Medicine

## 2022-07-19 ENCOUNTER — Emergency Department (HOSPITAL_BASED_OUTPATIENT_CLINIC_OR_DEPARTMENT_OTHER): Payer: Managed Care, Other (non HMO)

## 2022-07-19 DIAGNOSIS — R110 Nausea: Secondary | ICD-10-CM

## 2022-07-19 DIAGNOSIS — R1084 Generalized abdominal pain: Secondary | ICD-10-CM

## 2022-07-19 LAB — URINALYSIS, ROUTINE W REFLEX MICROSCOPIC
Bacteria, UA: NONE SEEN
Bilirubin Urine: NEGATIVE
Glucose, UA: NEGATIVE mg/dL
Leukocytes,Ua: NEGATIVE
Nitrite: NEGATIVE
Protein, ur: NEGATIVE mg/dL
Specific Gravity, Urine: 1.024 (ref 1.005–1.030)
pH: 5.5 (ref 5.0–8.0)

## 2022-07-19 LAB — LIPASE, BLOOD: Lipase: 36 U/L (ref 11–51)

## 2022-07-19 LAB — COMPREHENSIVE METABOLIC PANEL
ALT: 23 U/L (ref 0–44)
AST: 23 U/L (ref 15–41)
Albumin: 4.4 g/dL (ref 3.5–5.0)
Alkaline Phosphatase: 45 U/L (ref 38–126)
Anion gap: 13 (ref 5–15)
BUN: 18 mg/dL (ref 6–20)
CO2: 21 mmol/L — ABNORMAL LOW (ref 22–32)
Calcium: 9.9 mg/dL (ref 8.9–10.3)
Chloride: 102 mmol/L (ref 98–111)
Creatinine, Ser: 0.76 mg/dL (ref 0.44–1.00)
GFR, Estimated: >60 mL/min (ref >60)
Glucose, Bld: 104 mg/dL — ABNORMAL HIGH (ref 70–99)
Potassium: 4.1 mmol/L (ref 3.5–5.1)
Sodium: 136 mmol/L (ref 135–145)
Total Bilirubin: 0.2 mg/dL — ABNORMAL LOW (ref 0.3–1.2)
Total Protein: 7.5 g/dL (ref 6.5–8.1)

## 2022-07-19 LAB — PREGNANCY, URINE: Preg Test, Ur: NEGATIVE

## 2022-07-19 MED ORDER — ONDANSETRON 4 MG PO TBDP: 4.0000 mg | ORAL_TABLET | Freq: Three times a day (TID) | ORAL | 0 refills | Status: DC | PRN

## 2022-07-19 MED ORDER — MORPHINE SULFATE (PF) 4 MG/ML IV SOLN
4.0000 mg | Freq: Once | INTRAVENOUS | Status: AC
  Administered 2022-07-19: 4 mg via INTRAVENOUS
  Filled 2022-07-19: qty 1

## 2022-07-19 MED ORDER — ONDANSETRON HCL 4 MG/2ML IJ SOLN
4.0000 mg | Freq: Once | INTRAMUSCULAR | Status: AC
  Administered 2022-07-19: 4 mg via INTRAVENOUS
  Filled 2022-07-19: qty 2

## 2022-07-19 MED ORDER — IOHEXOL 300 MG/ML  SOLN
100.0000 mL | Freq: Once | INTRAMUSCULAR | Status: AC | PRN
  Administered 2022-07-19: 100 mL via INTRAVENOUS

## 2022-07-19 MED ORDER — DICYCLOMINE HCL 20 MG PO TABS: 20.0000 mg | ORAL_TABLET | Freq: Three times a day (TID) | ORAL | 0 refills | Status: AC | PRN

## 2022-07-19 NOTE — Discharge Instructions (Signed)

## 2022-07-19 NOTE — ED Provider Notes (Signed)
Emergency Department Provider Note   I have reviewed the triage vital signs and the nursing notes.   HISTORY  Chief Complaint Abdominal Pain   HPI Alison Tucker is a 47 y.o. female with past history reviewed below presents emergency department for evaluation of abdominal pain.  Symptoms are mainly in the lower abdomen with some radiation to the right flank.  No dysuria, hesitancy, urgency.  No fevers or chills.  Some nausea but no vomiting or diarrhea.  No vaginal bleeding or discharge.  No similar pain like this in the past. Symptoms began abruptly while relaxing at home. No injury.   Past Medical History:  Diagnosis Date   Anxiety    Chicken pox    Depression    Family history of brain cancer 09/17/2017   Son, negative genetic screening   Family history of brain stem cancer 09/17/2017   Sister's daughter; neg genetic testing   IUD (intrauterine device) in place - mirena 2018 09/17/2017    Review of Systems  Constitutional: No fever/chills Cardiovascular: Denies chest pain. Respiratory: Denies shortness of breath. Gastrointestinal: Positive abdominal pain. Positive nausea, no vomiting.  No diarrhea.  No constipation. Genitourinary: Negative for dysuria. Musculoskeletal: Negative for back pain. Skin: Negative for rash. Neurological: Negative for headaches.   ____________________________________________   PHYSICAL EXAM:  VITAL SIGNS: ED Triage Vitals  Enc Vitals Group     BP 07/18/22 2334 132/89     Pulse Rate 07/18/22 2334 (!) 109     Resp 07/18/22 2334 18     Temp 07/18/22 2334 98.3 F (36.8 C)     Temp src --      SpO2 07/18/22 2334 100 %     Weight 07/18/22 2332 145 lb (65.8 kg)     Height 07/18/22 2332 5\' 1"  (1.549 m)   Constitutional: Alert and oriented. Able to provide a full history but appears uncomfortable.  Eyes: Conjunctivae are normal.  Head: Atraumatic. Nose: No congestion/rhinnorhea. Mouth/Throat: Mucous membranes are moist.  Neck: No  stridor.  Cardiovascular: Normal rate, regular rhythm. Good peripheral circulation. Grossly normal heart sounds.   Respiratory: Normal respiratory effort.  No retractions. Lungs CTAB. Gastrointestinal: Soft and nontender. No distention.  Musculoskeletal: No lower extremity tenderness nor edema. No gross deformities of extremities. Neurologic:  Normal speech and language.  Skin:  Skin is warm, dry and intact. No rash noted.  ____________________________________________   LABS (all labs ordered are listed, but only abnormal results are displayed)  Labs Reviewed  COMPREHENSIVE METABOLIC PANEL - Abnormal; Notable for the following components:      Result Value   CO2 21 (*)    Glucose, Bld 104 (*)    Total Bilirubin 0.2 (*)    All other components within normal limits  URINALYSIS, ROUTINE W REFLEX MICROSCOPIC - Abnormal; Notable for the following components:   Hgb urine dipstick SMALL (*)    Ketones, ur TRACE (*)    All other components within normal limits  LIPASE, BLOOD  CBC  PREGNANCY, URINE   ____________________________________________  RADIOLOGY  CT ABDOMEN PELVIS W CONTRAST  Result Date: 07/19/2022 CLINICAL DATA:  Stomach pain with slight nausea. EXAM: CT ABDOMEN AND PELVIS WITH CONTRAST TECHNIQUE: Multidetector CT imaging of the abdomen and pelvis was performed using the standard protocol following bolus administration of intravenous contrast. RADIATION DOSE REDUCTION: This exam was performed according to the departmental dose-optimization program which includes automated exposure control, adjustment of the mA and/or kV according to patient size and/or use of iterative  reconstruction technique. CONTRAST:  OMNIPAQUE IOHEXOL 300 MG/ML  SOLN COMPARISON:  02/11/2021. FINDINGS: Lower chest: No acute abnormality. Hepatobiliary: No focal liver abnormality is seen. No gallstones, gallbladder wall thickening, or biliary dilatation. Pancreas: Unremarkable. No pancreatic ductal  dilatation or surrounding inflammatory changes. Spleen: Normal in size without focal abnormality. Adrenals/Urinary Tract: Adrenal glands are unremarkable. Kidneys are normal, without renal calculi, focal lesion, or hydronephrosis. Bladder is unremarkable. Stomach/Bowel: Stomach is within normal limits. Appendix appears normal. No evidence of bowel wall thickening, distention, or inflammatory changes. No free air or pneumatosis. Vascular/Lymphatic: No significant vascular findings are present. No enlarged abdominal or pelvic lymph nodes. Reproductive: An IUD is present in the uterus.  No adnexal mass. Other: No abdominopelvic ascites. A small fat containing umbilical hernia is present. Musculoskeletal: No acute osseous abnormality. Bilateral breast implants are noted. IMPRESSION: No acute intra-abdominal process. Electronically Signed   By: Thornell Sartorius M.D.   On: 07/19/2022 01:51    ____________________________________________   PROCEDURES  Procedure(s) performed:   Procedures  None  ____________________________________________   INITIAL IMPRESSION / ASSESSMENT AND PLAN / ED COURSE  Pertinent labs & imaging results that were available during my care of the patient were reviewed by me and considered in my medical decision making (see chart for details).   This patient is Presenting for Evaluation of abdominal pain, which does require a range of treatment options, and is a complaint that involves a high risk of morbidity and mortality.  The Differential Diagnoses includes but is not exclusive to acute cholecystitis, intrathoracic causes for epigastric abdominal pain, gastritis, duodenitis, pancreatitis, small bowel or large bowel obstruction, abdominal aortic aneurysm, hernia, gastritis, etc.   Critical Interventions-    Medications  morphine (PF) 4 MG/ML injection 4 mg (4 mg Intravenous Given 07/19/22 0043)  ondansetron (ZOFRAN) injection 4 mg (4 mg Intravenous Given 07/19/22 0044)   iohexol (OMNIPAQUE) 300 MG/ML solution 100 mL (100 mLs Intravenous Contrast Given 07/19/22 0124)    Reassessment after intervention:  symptoms improved.    I did obtain Additional Historical Information from husband at bedside.    Clinical Laboratory Tests Ordered, included pregnancy negative. Lipase and LFTs negative. CBC without leukocytosis or anemia.   Radiologic Tests Ordered, included CT abdomen/pelvis. I independently interpreted the images and agree with radiology interpretation.   Cardiac Monitor Tracing which shows tachycardia.    Social Determinants of Health Risk patient is a non-cmoker.   Medical Decision Making: Summary:  Patient presents emergency department with abdominal pain along with nausea.  Mild tenderness on exam but no peritonitis.  Plan for CT imaging along with labs and pain management.   Reevaluation with update and discussion with patient. She is feeling improved. Suspect acute gastritis. Vitals are WNL. Patient is looking well. Stable for discharge.   Considered admission but no acute findings. Stable for discharge.   Patient's presentation is most consistent with acute presentation with potential threat to life or bodily function.   Disposition: discharge  ____________________________________________  FINAL CLINICAL IMPRESSION(S) / ED DIAGNOSES  Final diagnoses:  Generalized abdominal pain  Nausea     NEW OUTPATIENT MEDICATIONS STARTED DURING THIS VISIT:  Discharge Medication List as of 07/19/2022  2:47 AM     START taking these medications   Details  dicyclomine (BENTYL) 20 MG tablet Take 1 tablet (20 mg total) by mouth 3 (three) times daily as needed for spasms., Starting Sat 07/19/2022, Normal    ondansetron (ZOFRAN-ODT) 4 MG disintegrating tablet Take 1 tablet (4 mg  total) by mouth every 8 (eight) hours as needed., Starting Sat 07/19/2022, Normal        Note:  This document was prepared using Dragon voice recognition software and may  include unintentional dictation errors.  Alona Bene, MD, [facility] Emergency Medicine    Mahala Rommel, Arlyss Repress, MD 07/19/22 [phone]

## 2022-07-19 NOTE — ED Notes (Signed)
Patient to CT via wc reports feeling better. pain 5/10

## 2022-07-31 ENCOUNTER — Encounter: Payer: Self-pay | Admitting: Family Medicine

## 2022-07-31 ENCOUNTER — Ambulatory Visit: Payer: Managed Care, Other (non HMO) | Admitting: Family Medicine

## 2022-07-31 ENCOUNTER — Other Ambulatory Visit (HOSPITAL_COMMUNITY)
Admission: RE | Admit: 2022-07-31 | Discharge: 2022-07-31 | Disposition: A | Discharge status: Home / Self Care | Payer: Managed Care, Other (non HMO) | Source: Ambulatory Visit | Attending: Family Medicine | Admitting: Family Medicine

## 2022-07-31 VITALS — BP 112/88 | HR 94 | Temp 97.9°F | Ht 61.0 in | Wt 148.2 lb

## 2022-07-31 DIAGNOSIS — K582 Mixed irritable bowel syndrome: Secondary | ICD-10-CM

## 2022-07-31 DIAGNOSIS — N951 Menopausal and female climacteric states: Secondary | ICD-10-CM

## 2022-07-31 DIAGNOSIS — Z1212 Encounter for screening for malignant neoplasm of rectum: Secondary | ICD-10-CM

## 2022-07-31 DIAGNOSIS — Z124 Encounter for screening for malignant neoplasm of cervix: Secondary | ICD-10-CM

## 2022-07-31 DIAGNOSIS — Z1211 Encounter for screening for malignant neoplasm of colon: Secondary | ICD-10-CM

## 2022-07-31 DIAGNOSIS — R87810 Cervical high risk human papillomavirus (HPV) DNA test positive: Secondary | ICD-10-CM | POA: Insufficient documentation

## 2022-07-31 DIAGNOSIS — R87610 Atypical squamous cells of undetermined significance on cytologic smear of cervix (ASC-US): Secondary | ICD-10-CM

## 2022-07-31 DIAGNOSIS — F4322 Adjustment disorder with anxiety: Secondary | ICD-10-CM

## 2022-07-31 DIAGNOSIS — Z0000 Encounter for general adult medical examination without abnormal findings: Secondary | ICD-10-CM | POA: Diagnosis present

## 2022-07-31 DIAGNOSIS — Z975 Presence of (intrauterine) contraceptive device: Secondary | ICD-10-CM | POA: Diagnosis not present

## 2022-07-31 DIAGNOSIS — M5416 Radiculopathy, lumbar region: Secondary | ICD-10-CM

## 2022-07-31 LAB — COMPREHENSIVE METABOLIC PANEL
ALT: 39 U/L — ABNORMAL HIGH (ref 0–35)
AST: 28 U/L (ref 0–37)
Albumin: 4.4 g/dL (ref 3.5–5.2)
Alkaline Phosphatase: 61 U/L (ref 39–117)
BUN: 9 mg/dL (ref 6–23)
CO2: 28 mEq/L (ref 19–32)
Calcium: 9.1 mg/dL (ref 8.4–10.5)
Chloride: 102 mEq/L (ref 96–112)
Creatinine, Ser: 0.6 mg/dL (ref 0.40–1.20)
GFR: 108.17 mL/min (ref >60.00)
Glucose, Bld: 83 mg/dL (ref 70–99)
Potassium: 4.5 mEq/L (ref 3.5–5.1)
Sodium: 138 mEq/L (ref 135–145)
Total Bilirubin: 0.4 mg/dL (ref 0.2–1.2)
Total Protein: 7.5 g/dL (ref 6.0–8.3)

## 2022-07-31 LAB — CBC WITH DIFFERENTIAL/PLATELET
Basophils Absolute: 0 10*3/uL (ref 0.0–0.1)
Basophils Relative: 0.7 % (ref 0.0–3.0)
Eosinophils Absolute: 0.1 10*3/uL (ref 0.0–0.7)
Eosinophils Relative: 2.4 % (ref 0.0–5.0)
HCT: 39.6 % (ref 36.0–46.0)
Hemoglobin: 13.4 g/dL (ref 12.0–15.0)
Lymphocytes Relative: 29.9 % (ref 12.0–46.0)
Lymphs Abs: 1.8 10*3/uL (ref 0.7–4.0)
MCHC: 33.9 g/dL (ref 30.0–36.0)
MCV: 97.6 fl (ref 78.0–100.0)
Monocytes Absolute: 0.5 10*3/uL (ref 0.1–1.0)
Monocytes Relative: 8.8 % (ref 3.0–12.0)
Neutro Abs: 3.4 10*3/uL (ref 1.4–7.7)
Neutrophils Relative %: 58.2 % (ref 43.0–77.0)
Platelets: 315 10*3/uL (ref 150.0–400.0)
RBC: 4.05 Mil/uL (ref 3.87–5.11)
RDW: 13.8 % (ref 11.5–15.5)
WBC: 5.9 10*3/uL (ref 4.0–10.5)

## 2022-07-31 LAB — LIPID PANEL
Cholesterol: 234 mg/dL — ABNORMAL HIGH (ref 0–200)
HDL: 45.5 mg/dL (ref >39.00)
LDL Cholesterol: 157 mg/dL — ABNORMAL HIGH (ref 0–99)
NonHDL: 188.67
Total CHOL/HDL Ratio: 5
Triglycerides: 157 mg/dL — ABNORMAL HIGH (ref 0.0–149.0)
VLDL: 31.4 mg/dL (ref 0.0–40.0)

## 2022-07-31 LAB — FOLLICLE STIMULATING HORMONE: FSH: 5.5 m[IU]/mL

## 2022-07-31 LAB — TSH: TSH: 1 u[IU]/mL (ref 0.35–5.50)

## 2022-07-31 MED ORDER — DICLOFENAC SODIUM 75 MG PO TBEC: 75.0000 mg | DELAYED_RELEASE_TABLET | Freq: Two times a day (BID) | ORAL | 2 refills | Status: DC | PRN

## 2022-07-31 NOTE — Patient Instructions (Signed)
Please return in 12 months for your annual complete physical; please come fasting.   I will release your lab results to you on your MyChart account with further instructions. You may see the results before I do, but when I review them I will send you a message with my report or have my assistant call you if things need to be discussed. Please reply to my message with any questions. Thank you!   We will call you with information regarding your referral appointment. Gastroenterology for your colonoscopy.  If you do not hear from [place] within the next 2 weeks, please let me know. It can take 1-2 weeks to get appointments set up with the specialists.    If you have any questions or concerns, please don't hesitate to send me a message via MyChart or call the office at [phone]. Thank you for visiting with [place] today! It's our pleasure caring for you.

## 2022-07-31 NOTE — Progress Notes (Signed)
Subjective  Chief Complaint  Patient presents with   Annual Exam   Gynecologic Exam    HPI: Alison Tucker is a 47 y.o. female who presents to [facility] [place] today for a Female Wellness Visit. She also has the concerns and/or needs as listed above in the chief complaint. These will be addressed in addition to the Health Maintenance Visit.   Wellness Visit: annual visit with health maintenance review and exam with Pap  Health maintenance: Mammogram scheduled for next month.  Eligible for colorectal cancer screening.  Pap smear due today, follow-up high risk HPV positivity with normal Pap done last year.  IUD in place, 2018.  Amenorrheic.  Uses for birth control.  Having some perimenopausal symptoms including hot flashes. Chronic disease f/u and/or acute problem visit: (deemed necessary to be done in addition to the wellness visit): Reviewed ED note from last week: Right lower quadrant pain acute and moderate to severe.  Thorough workup including lab work and abdominal pelvic CT scan were unremarkable.  Symptoms improved over the next 24 hours.  Still with mild fluttering type pains but otherwise stable.  Normal bowel movements.  Does have IBS.  Some cramping. Lumbar radiculopathy: Reviewed lumbar spine MRI showing central protrusion disc impinging S1.  Having moderate to severe symptoms that are persistent on the right.  Working with specialist.  Has tried prednisone, gabapentin, 2 epidural steroid injections without relief.  Now on Cymbalta.  Using ibuprofen for pain.  Physical therapy.  Chronic pain has become difficult. Anxiety: Cymbalta has helped manage her stress symptoms.  Assessment  1. Annual physical exam   2. Cervical cancer screening   3. Cervical high risk HPV (human papillomavirus) test positive   4. Screening for colorectal cancer   5. Lumbar radiculopathy   6. Hot flushes, perimenopausal   7. Irritable bowel syndrome with both constipation and  diarrhea   8. IUD (intrauterine device) in place   9. Adjustment disorder with anxiety      Plan  Female Wellness Visit: Age appropriate Health Maintenance and Prevention measures were discussed with patient. Included topics are cancer screening recommendations, ways to keep healthy (see AVS) including dietary and exercise recommendations, regular eye and dental care, use of seat belts, and avoidance of moderate alcohol use and tobacco use.  Pap smear today.  Colonoscopy referral placed.  Mammogram next month BMI: discussed patient's BMI and encouraged positive lifestyle modifications to help get to or maintain a target BMI. HM needs and immunizations were addressed and ordered. See below for orders. See HM and immunization section for updates. Routine labs and screening tests ordered including cmp, cbc and lipids where appropriate. Discussed recommendations regarding Vit D and calcium supplementation (see AVS)  Chronic disease management visit and/or acute problem visit: Lumbar radiculopathy: Very active.  Trial of Voltaren twice daily.  Continue with neurosurgery/pain management, Cymbalta and physical therapy Suspect IBS flare, recent lower abdominal pain.  Improved.  Bentyl as needed.  Dietary management. Premenopausal hot flashes with Mirena IUD in place.  Check FSH.  Now on Cymbalta, monitor for worsening symptoms. IUD: Good for 7 to 8 years.  Consider replacement next year.  Her cervix is slightly displaced to the right, will refer to GYN for removal and replacement at that time Mood: Stressed because of chronic pain, continue Cymbalta  Follow up: 12 months for complete physical Orders Placed This Encounter  Procedures   CBC with Differential/Platelet   Comprehensive metabolic panel   Lipid panel  TSH   FSH   Ambulatory referral to Gastroenterology   Meds ordered this encounter  Medications   diclofenac (VOLTAREN) 75 MG EC tablet    Sig: Take 1 tablet (75 mg total) by mouth 2  (two) times daily as needed.    Dispense:  60 tablet    Refill:  2      Body mass index is 28 kg/m. Wt Readings from Last 3 Encounters:  07/31/22 148 lb 3.2 oz (67.2 kg)  07/18/22 145 lb (65.8 kg)  05/02/22 146 lb 6.4 oz (66.4 kg)     Patient Active Problem List   Diagnosis Date Noted   Family history of brain cancer 09/17/2017    Priority: High    Son, negative genetic screening    Family history of brain stem cancer 09/17/2017    Priority: High    Sister's daughter; neg genetic testing    Adjustment disorder with anxiety 01/17/2021    Priority: Medium    Low back pain, episodic 01/17/2021    Priority: Medium     Lumbar radiculopathy 2024: cymbalta for pain control started Lumbar spine MRI: IMPRESSION:  L5-S1 posterior central disc protrusion is abutting the bilateral transversing S1 nerve roots, facet arthropathy at other levels.    IBS (irritable bowel syndrome) 03/10/2013    Priority: Medium    Allergic rhinitis due to animal (cat) (dog) hair and dander 07/26/2021    Priority: Low   Allergic rhinitis due to pollen 07/26/2021    Priority: Low   Chronic allergic conjunctivitis 07/26/2021    Priority: Low   IUD (intrauterine device) in place - mirena 2018 09/17/2017    Priority: Low   Snoring 12/30/2021    Pulm eval 2023; negative home sleep study    Cervical high risk HPV (human papillomavirus) test positive 08/02/2021    Nl pap, 07/2021; repeat 07/2022.Repeat pap and cotest with hpv in 1 year. 5 year fisk of cin3+ is 4.8% ASCCP guidelines.     Health Maintenance  Topic Date Due   COLONOSCOPY (Pts 45-40yrs Insurance coverage will need to be confirmed)  Never done   COVID-19 Vaccine (10 - 2023-24 season) 02/15/2022   PAP SMEAR-Modifier  07/27/2022   INFLUENZA VACCINE  11/06/2022   MAMMOGRAM  02/22/2023   DTaP/Tdap/Td (3 - Td or Tdap) 08/19/2023   Hepatitis C Screening  Completed   HIV Screening  Completed   HPV VACCINES  Aged Out   Immunization History   Administered Date(s) Administered   Influenza Inj Mdck Quad With Preservative 12/20/2020   Influenza, High Dose Seasonal PF 03/09/2019   Influenza, Quadrivalent, Recombinant, Inj, Pf 01/18/2018   Influenza,inj,Quad PF,6+ Mos 11/22/2021   MMR 08/18/2013, 08/18/2013   PFIZER Comirnaty(Gray Top)Covid-19 Tri-Sucrose Vaccine 06/19/2019, 07/11/2019, 03/10/2020   PFIZER(Purple Top)SARS-COV-2 Vaccination 06/19/2019, 07/11/2019, 08/18/2019, 03/10/2020   Pfizer Covid-19 Vaccine Bivalent Booster 64yrs & up 12/26/2020, 12/21/2021   Tdap 01/02/2009, 08/18/2013   Varicella 08/18/2013   We updated and reviewed the patient's past history in detail and it is documented below. Allergies: Patient has No Known Allergies. Past Medical History Patient  has a past medical history of Anxiety, Chicken pox, Closed nondisplaced fracture of lateral malleolus of left fibula, Depression, Family history of brain cancer (09/17/2017), Family history of brain stem cancer (09/17/2017), and IUD (intrauterine device) in place - mirena 2018 (09/17/2017). Past Surgical History Patient  has a past surgical history that includes Cosmetic surgery (2010); Augmentation mammaplasty (Bilateral, 2010); and ORIF ankle fracture (Left, 10/23/2021). Family History: Patient  family history includes Alcohol abuse in her maternal grandfather; Arthritis in her maternal grandmother and mother; Brain cancer in an other family member; Brain cancer (age of onset: 37) in her son; Hyperlipidemia in her mother; Hypertension in her mother; Learning disabilities in her daughter; Mental retardation in her daughter; Seizures in her daughter; Stroke in her mother. Social History:  Patient  reports that she quit smoking about 11 years ago. Her smoking use included cigarettes. She has a 0.70 pack-year smoking history. She has never been exposed to tobacco smoke. She has never used smokeless tobacco. She reports current alcohol use of about 3.0 standard drinks of  alcohol per week. She reports that she does not use drugs.  Review of Systems: Constitutional: negative for fever or malaise Ophthalmic: negative for photophobia, double vision or loss of vision Cardiovascular: negative for chest pain, dyspnea on exertion, or new LE swelling Respiratory: negative for SOB or persistent cough Gastrointestinal: negative for abdominal pain, change in bowel habits or melena Genitourinary: negative for dysuria or gross hematuria, no abnormal uterine bleeding or disharge Musculoskeletal: negative for new gait disturbance or muscular weakness Integumentary: negative for new or persistent rashes, no breast lumps Neurological: negative for TIA or stroke symptoms Psychiatric: negative for SI or delusions Allergic/Immunologic: negative for hives  Patient Care Team    Relationship Specialty Notifications Start End  Willow Ora, MD PCP - General Family Medicine  09/17/17   Irena Cords, Enzo Montgomery, MD Consulting Physician Allergy and Immunology  08/23/19     Objective  Vitals: BP 112/88 (BP Location: Left Arm, Patient Position: Sitting)   Pulse 94   Temp 97.9 F (36.6 C) (Temporal)   Ht  (1.549 m)   Wt 148 lb 3.2 oz (67.2 kg)   SpO2 98%   BMI 28.00 kg/m  General:  Well developed, well nourished, appears uncomfortable Psych:  Alert and orientedx3,normal mood and affect HEENT:  Normocephalic, atraumatic, non-icteric sclera,  supple neck without adenopathy, mass or thyromegaly Cardiovascular:  Normal S1, S2, RRR without gallop, rub or murmur Respiratory:  Good breath sounds bilaterally, CTAB with normal respiratory effort Gastrointestinal: normal bowel sounds, soft, non-tender, no noted masses. No HSM MSK: extremities without edema, joints without erythema or swelling Neurologic:    Mental status is normal.  Gross motor and sensory exams are normal.  No tremor Pelvic Exam: Normal external genitalia, no vulvar or vaginal lesions present. Clear cervix w/o CMT.  IUD string visible.  Bimanual exam reveals a nontender fundus w/o masses, nl size. No adnexal masses present. No inguinal adenopathy. A PAP smear was performed.    Commons side effects, risks, benefits, and alternatives for medications and treatment plan prescribed today were discussed, and the patient expressed understanding of the given instructions. Patient is instructed to call or message via MyChart if he/she has any questions or concerns regarding our treatment plan. No barriers to understanding were identified. We discussed Red Flag symptoms and signs in detail. Patient expressed understanding regarding what to do in case of urgent or emergency type symptoms.  Medication list was reconciled, printed and provided to the patient in AVS. Patient instructions and summary information was reviewed with the patient as documented in the AVS. This note was prepared with assistance of Dragon voice recognition software. Occasional wrong-word or sound-a-like substitutions may have occurred due to the inherent limitations of voice recognition software

## 2022-08-05 LAB — CYTOLOGY - PAP
Comment: NEGATIVE
Comment: NEGATIVE
Comment: NEGATIVE
Diagnosis: UNDETERMINED — AB
HPV 16: NEGATIVE
HPV 18 / 45: NEGATIVE
High risk HPV: POSITIVE — AB

## 2022-08-06 ENCOUNTER — Encounter: Payer: Self-pay | Admitting: Family Medicine

## 2022-08-06 NOTE — Addendum Note (Signed)
Addended by: Asencion Partridge on: 08/06/2022 01:00 PM   Modules accepted: Orders

## 2022-08-12 ENCOUNTER — Encounter: Payer: Self-pay | Admitting: Gastroenterology

## 2022-08-22 ENCOUNTER — Ambulatory Visit
Admission: RE | Admit: 2022-08-22 | Discharge: 2022-08-22 | Disposition: A | Discharge status: Home / Self Care | Payer: Managed Care, Other (non HMO) | Source: Ambulatory Visit | Attending: Plastic Surgery | Admitting: Plastic Surgery

## 2022-08-22 DIAGNOSIS — R928 Other abnormal and inconclusive findings on diagnostic imaging of breast: Secondary | ICD-10-CM

## 2022-08-23 IMAGING — MR MR ANKLE*L* W/O CM
5 series · 36 of 40 positions shown · non-contrast
Comparison: Radiographs dated August 15, 2021 probe she had similar
fracture like semi see voice Felner phone

CLINICAL DATA: Ankle pain tendon abnormality suspected.

EXAM:
MRI OF THE LEFT ANKLE WITHOUT CONTRAST
TECHNIQUE: Multiplanar, multisequence MR imaging of the ankle was performed. No
intravenous contrast was administered.

[Series 4: T2 fat-sat · axial · 3.0mm · 0.50mm/px · z∈[-83,+38]mm · 9 of 32 slices shown (1 of 2)]
[im 1/32]
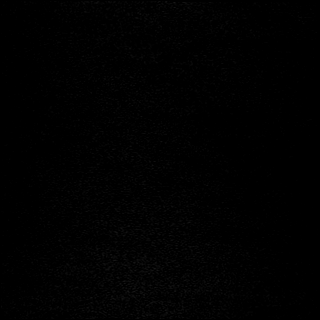
[im 4/32]
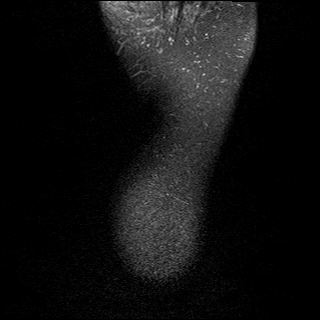
[im 8/32]
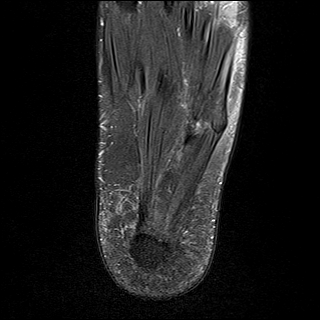
[im 12/32]
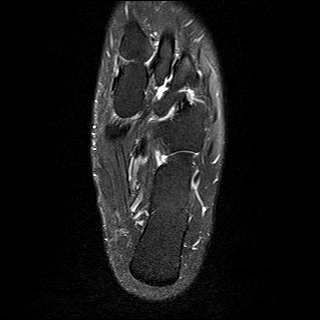
[im 16/32]
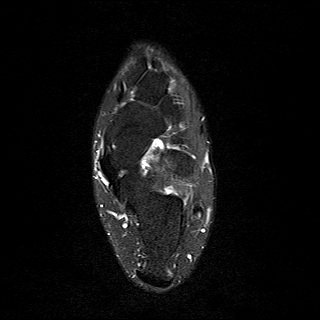
[im 20/32]
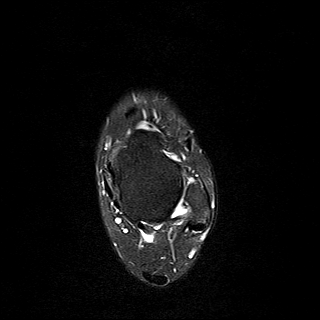
[im 24/32]
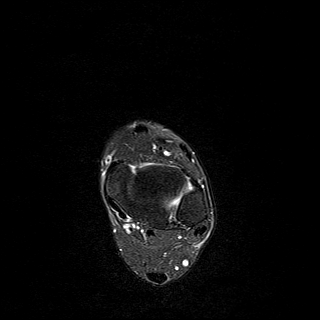
[im 28/32]
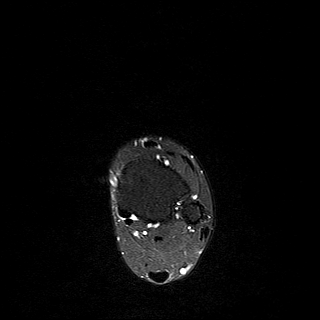
[im 32/32]
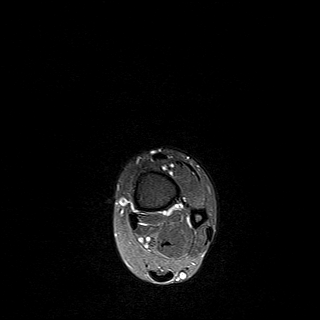

[Series 5: PD fat-sat · axial · 3.0mm · 0.42mm/px · z∈[-83,+38]mm · 9 of 32 slices shown]
[im 1/32]
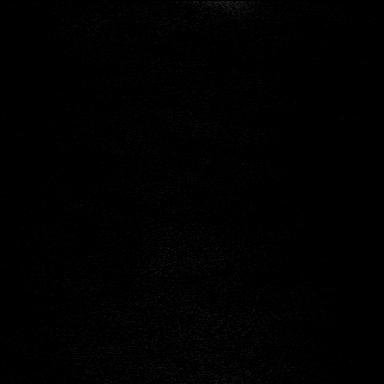
[im 4/32]
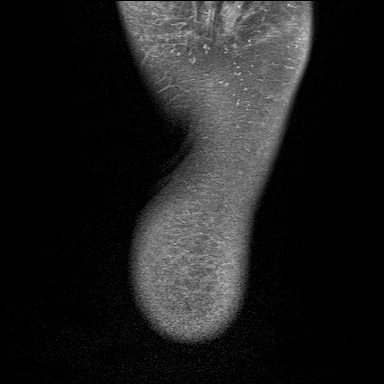
[im 8/32]
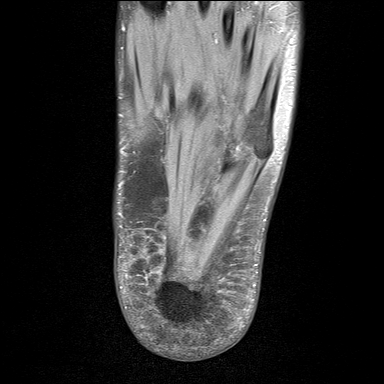
[im 12/32]
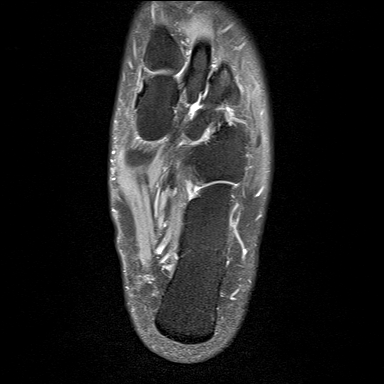
[im 16/32]
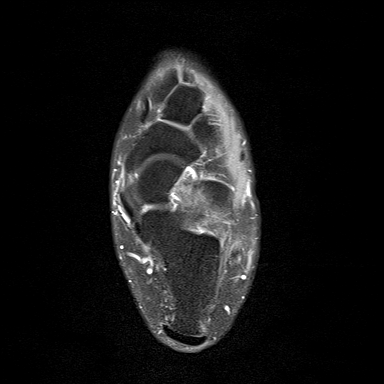
[im 20/32]
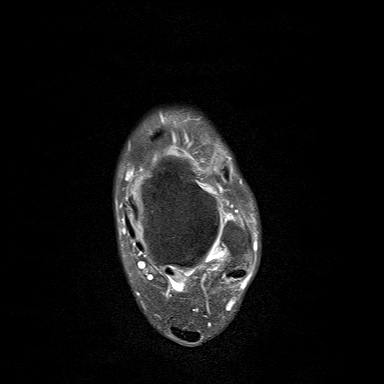
[im 24/32]
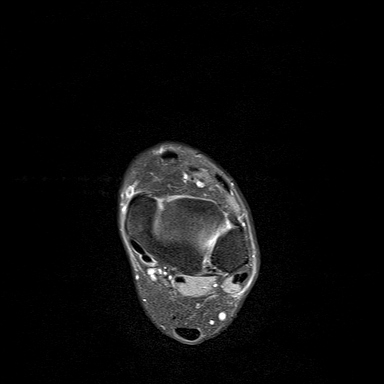
[im 28/32]
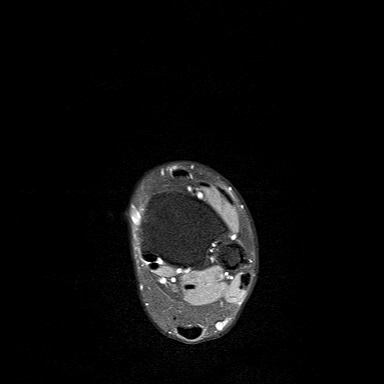
[im 32/32]
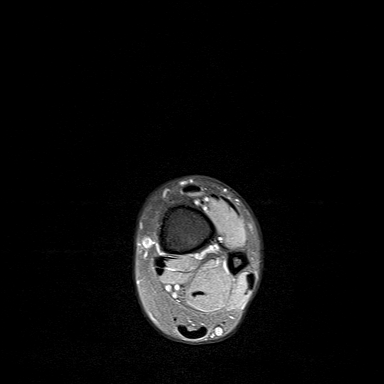

[Series 6: T1 · sagittal · 4.0mm · 0.56mm/px · 6 of 20 slices shown]
[im 1/20]
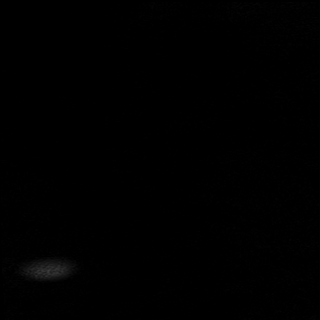
[im 4/20]
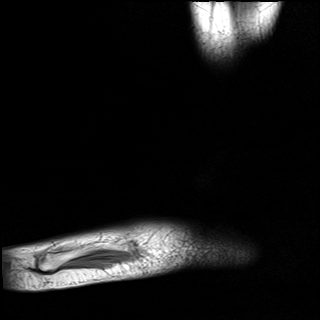
[im 8/20]
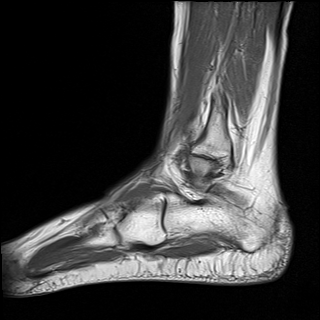
[im 12/20]
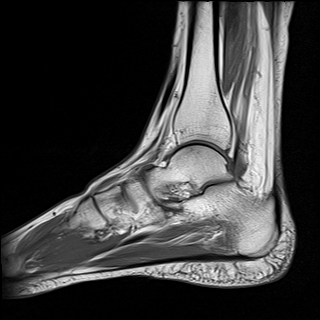
[im 16/20]
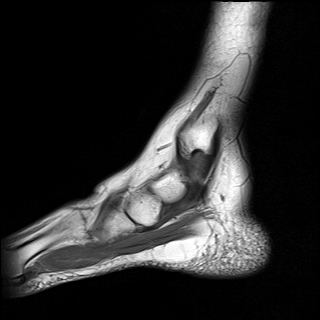
[im 20/20]
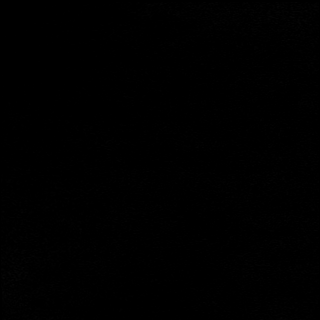

[Series 7: STIR · sagittal · 4.0mm · 0.35mm/px · 4 of 20 slices shown]
[im 1/20]
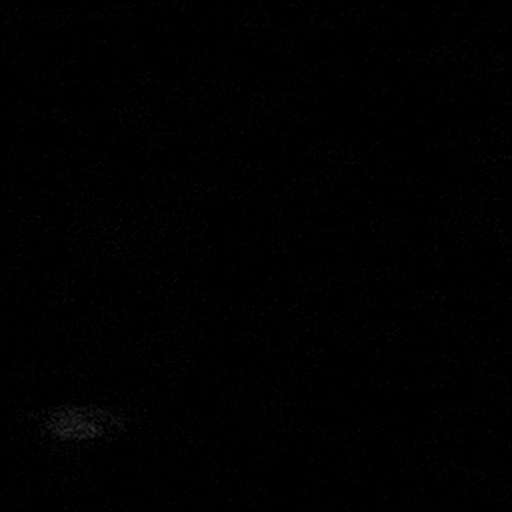
[im 4/20]
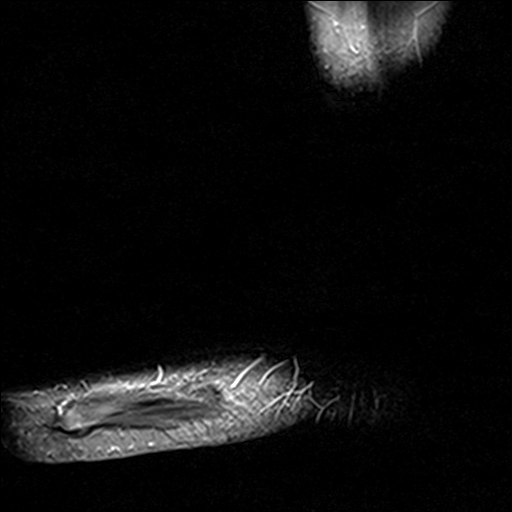
[im 8/20]
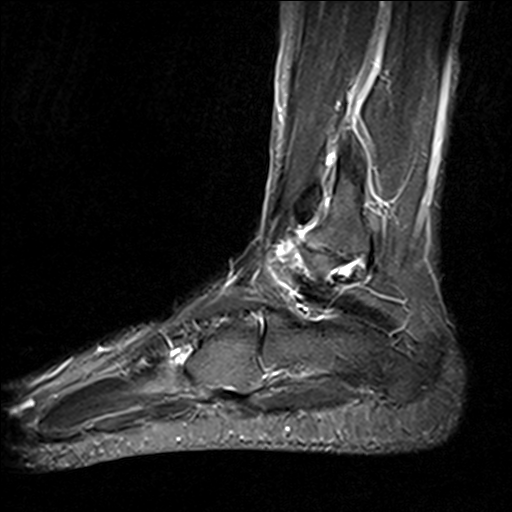
[im 12/20]
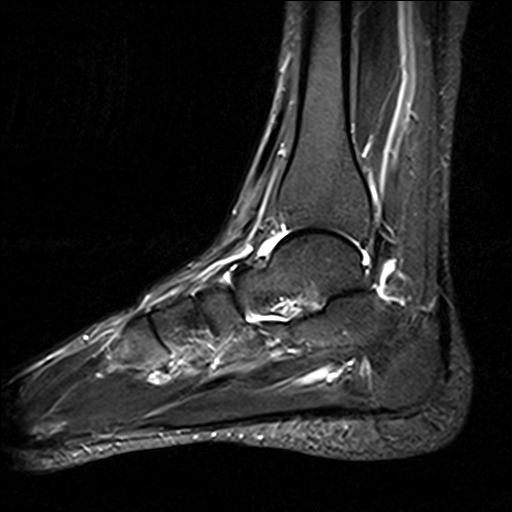

[Series 8: T2 fat-sat · coronal · 3.0mm · 0.50mm/px · 8 of 37 slices shown (2 of 2)]
[im 1/37]
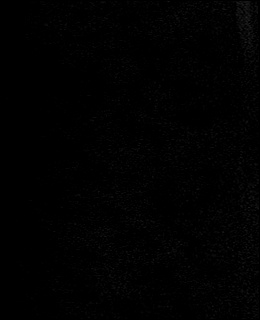
[im 5/37]
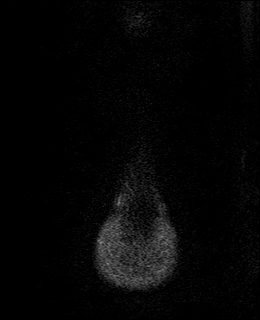
[im 13/37]
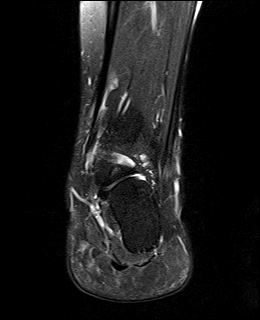
[im 17/37]
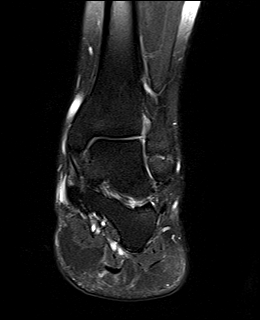
[im 21/37]
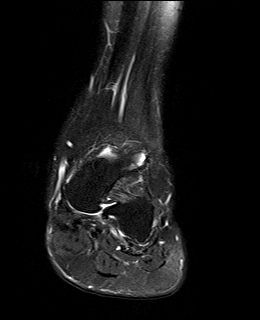
[im 25/37]
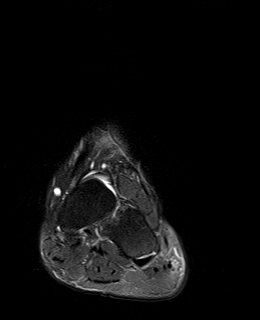
[im 33/37]
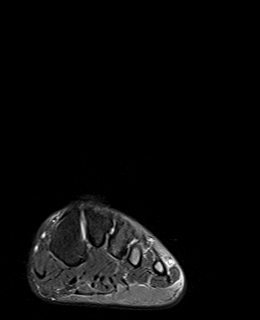
[im 37/37]
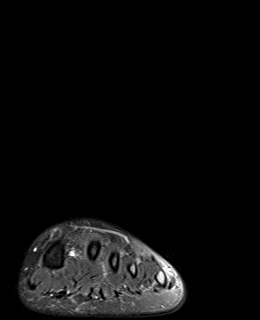

[36 of 40 positions shown; findings below may reference images not displayed]

FINDINGS: Bones: T1 hypointense and T2 hypointense line through the lateral
malleolus with minimal edema suggesting subacute lateral malleolar
fracture (series 8 image 16; series 7 image 7).

TENDONS

Peroneal: Peroneal longus tendon intact. Peroneal brevis intact.

Posteromedial: Posterior tibial tendon intact. Flexor hallucis
longus tendon intact. Flexor digitorum longus tendon intact.

Anterior: Tibialis anterior tendon intact. Extensor hallucis longus
tendon intact Extensor digitorum longus tendon intact.

Achilles:  Intact.

Plantar Fascia: Intact.

LIGAMENTS

Lateral: Anterior talofibular ligament intact. Calcaneofibular
ligament intact. Posterior talofibular ligament intact. Anterior and
posterior tibiofibular ligaments intact.

Medial: Deltoid ligament intact. Spring ligament intact.

CARTILAGE

Ankle Joint: No joint effusion. Normal ankle mortise. No chondral
defect.

Subtalar Joints/Sinus Tarsi: Normal subtalar joints. No subtalar
joint effusion. Normal sinus tarsi.

Soft Tissue: No fluid collection or hematoma. Muscles are normal
without edema or atrophy. Tarsal tunnel is normal.
IMPRESSION: 1.  Subacute nondisplaced fracture through the lateral malleolus.

2.  Ligaments and tendons are intact.

## 2022-09-04 ENCOUNTER — Ambulatory Visit (AMBULATORY_SURGERY_CENTER): Payer: Managed Care, Other (non HMO)

## 2022-09-04 VITALS — Ht 61.0 in | Wt 145.0 lb

## 2022-09-04 DIAGNOSIS — Z1211 Encounter for screening for malignant neoplasm of colon: Secondary | ICD-10-CM

## 2022-09-04 MED ORDER — NA SULFATE-K SULFATE-MG SULF 17.5-3.13-1.6 GM/177ML PO SOLN
1.0000 | Freq: Once | ORAL | 0 refills | Status: AC
Start: 2022-09-04 — End: 2022-09-04

## 2022-09-04 NOTE — Progress Notes (Signed)

## 2022-09-15 DIAGNOSIS — M48061 Spinal stenosis, lumbar region without neurogenic claudication: Secondary | ICD-10-CM | POA: Insufficient documentation

## 2022-09-15 DIAGNOSIS — M51369 Other intervertebral disc degeneration, lumbar region without mention of lumbar back pain or lower extremity pain: Secondary | ICD-10-CM | POA: Insufficient documentation

## 2022-09-18 ENCOUNTER — Encounter: Payer: Self-pay | Admitting: Gastroenterology

## 2022-10-02 ENCOUNTER — Encounter: Payer: Self-pay | Admitting: Gastroenterology

## 2022-10-02 ENCOUNTER — Ambulatory Visit (AMBULATORY_SURGERY_CENTER): Payer: 59 | Admitting: Gastroenterology

## 2022-10-02 VITALS — BP 127/80 | HR 74 | Temp 97.5°F | Resp 11 | Ht 61.0 in | Wt 145.0 lb

## 2022-10-02 DIAGNOSIS — Z1211 Encounter for screening for malignant neoplasm of colon: Secondary | ICD-10-CM | POA: Diagnosis present

## 2022-10-02 MED ORDER — SODIUM CHLORIDE 0.9 % IV SOLN
500.0000 mL | Freq: Once | INTRAVENOUS | Status: DC
Start: 2022-10-02 — End: 2022-10-02

## 2022-10-02 NOTE — Progress Notes (Signed)
Uneventful anesthetic. Report to pacu rn. Vss. Care resumed by rn. 

## 2022-10-02 NOTE — Op Note (Signed)
[facility] Patient Name: Alison Tucker Procedure Date: 10/02/2022 9:02 AM MRN: [id number] Endoscopist: Lorin Picket E. Tomasa Rand , MD, [id number] Age: 47 Referring MD:  Date of Birth: 1975/10/10 Gender: Female Account #: [id number] Procedure:                Colonoscopy Indications:              Screening for colorectal malignant neoplasm, This                            is the patient's first colonoscopy Medicines:                Monitored Anesthesia Care Procedure:                Pre-Anesthesia Assessment:                           - Prior to the procedure, a History and Physical                            was performed, and patient medications and                            allergies were reviewed. The patient's tolerance of                            previous anesthesia was also reviewed. The risks                            and benefits of the procedure and the sedation                            options and risks were discussed with the patient.                            All questions were answered, and informed consent                            was obtained. Prior Anticoagulants: The patient has                            taken no anticoagulant or antiplatelet agents. ASA                            Grade Assessment: II - A patient with mild systemic                            disease. After reviewing the risks and benefits,                            the patient was deemed in satisfactory condition to                            undergo the procedure.  After obtaining informed consent, the colonoscope                            was passed under direct vision. Throughout the                            procedure, the patient's blood pressure, pulse, and                            oxygen saturations were monitored continuously. The                            CF [id number] #[id number] was introduced through the anus                            and advanced to  the the cecum, identified by                            appendiceal orifice and ileocecal valve. The                            colonoscopy was performed without difficulty. The                            patient tolerated the procedure well. The quality                            of the bowel preparation was good. The ileocecal                            valve, appendiceal orifice, and rectum were                            photographed. The bowel preparation used was SUPREP                            via split dose instruction. Scope In: 9:07:05 AM Scope Out: 9:20:16 AM Scope Withdrawal Time: 0 hours 7 minutes 41 seconds  Total Procedure Duration: 0 hours 13 minutes 11 seconds  Findings:                 Skin tags were found on perianal exam.                           The digital rectal exam was normal. Pertinent                            negatives include normal sphincter tone and no                            palpable rectal lesions.                           A single small-mouthed diverticulum was found in  the transverse colon.                           The exam was otherwise normal throughout the                            examined colon.                           The retroflexed view of the distal rectum and anal                            verge was normal and showed no anal or rectal                            abnormalities. Complications:            No immediate complications. Estimated Blood Loss:     Estimated blood loss: none. Impression:               - Perianal skin tags found on perianal exam.                           - Diverticulosis in the transverse colon.                           - The distal rectum and anal verge are normal on                            retroflexion view.                           - No specimens collected. Recommendation:           - Patient has a contact number available for                            emergencies. The  signs and symptoms of potential                            delayed complications were discussed with the                            patient. Return to normal activities tomorrow.                            Written discharge instructions were provided to the                            patient.                           - Resume previous diet.                           - Continue present medications.                           -  Repeat colonoscopy in 10 years for screening                            purposes. Lawrence Roldan E. Tomasa Rand, MD 10/02/2022 9:24:48 AM This report has been signed electronically.

## 2022-10-02 NOTE — Patient Instructions (Signed)
Please read handouts provided. Continue present medications. Repeat colonoscopy in 10 years for screening.   YOU HAD AN ENDOSCOPIC PROCEDURE TODAY AT THE [facility]:   Refer to the procedure report that was given to you for any specific questions about what was found during the examination.  If the procedure report does not answer your questions, please call your gastroenterologist to clarify.  If you requested that your care partner not be given the details of your procedure findings, then the procedure report has been included in a sealed envelope for you to review at your convenience later.  YOU SHOULD EXPECT: Some feelings of bloating in the abdomen. Passage of more gas than usual.  Walking can help get rid of the air that was put into your GI tract during the procedure and reduce the bloating. If you had a lower endoscopy (such as a colonoscopy or flexible sigmoidoscopy) you may notice spotting of blood in your stool or on the toilet paper. If you underwent a bowel prep for your procedure, you may not have a normal bowel movement for a few days.  Please Note:  You might notice some irritation and congestion in your nose or some drainage.  This is from the oxygen used during your procedure.  There is no need for concern and it should clear up in a day or so.  SYMPTOMS TO REPORT IMMEDIATELY:  Following lower endoscopy (colonoscopy or flexible sigmoidoscopy):  Excessive amounts of blood in the stool  Significant tenderness or worsening of abdominal pains  Swelling of the abdomen that is new, acute  Fever of 100F or higher.  For urgent or emergent issues, a gastroenterologist can be reached at any hour by calling [phone]. Do not use MyChart messaging for urgent concerns.    DIET:  We do recommend a small meal at first, but then you may proceed to your regular diet.  Drink plenty of fluids but you should avoid alcoholic beverages for 24 hours.  ACTIVITY:  You should  plan to take it easy for the rest of today and you should NOT DRIVE or use heavy machinery until tomorrow (because of the sedation medicines used during the test).    FOLLOW UP: Our staff will call the number listed on your records the next business day following your procedure.  We will call around 7:15- 8:00 am to check on you and address any questions or concerns that you may have regarding the information given to you following your procedure. If we do not reach you, we will leave a message.     If any biopsies were taken you will be contacted by phone or by letter within the next 1-3 weeks.  Please call us at [phone] if you have not heard about the biopsies in 3 weeks.    SIGNATURES/CONFIDENTIALITY: You and/or your care partner have signed paperwork which will be entered into your electronic medical record.  These signatures attest to the fact that that the information above on your After Visit Summary has been reviewed and is understood.  Full responsibility of the confidentiality of this discharge information lies with you and/or your care-partner. 

## 2022-10-02 NOTE — Progress Notes (Signed)
[facility] History and Physical   Primary Care Physician:  Willow Ora, MD   Reason for Procedure:   Colon cancer screening  Plan:    Screening colonoscopy     HPI: Alison Tucker is a 47 y.o. female undergoing initial average risk screening colonoscopy.  She has no family history of colon cancer.    Past Medical History:  Diagnosis Date   Anxiety    Chicken pox    Closed nondisplaced fracture of lateral malleolus of left fibula    Depression    Family history of brain cancer 09/17/2017   Son, negative genetic screening   Family history of brain stem cancer 09/17/2017   Sister's daughter; neg genetic testing   IUD (intrauterine device) in place - mirena 2018 09/17/2017    Past Surgical History:  Procedure Laterality Date   AUGMENTATION MAMMAPLASTY Bilateral 2010   COSMETIC SURGERY  2010   ORIF ANKLE FRACTURE Left 10/23/2021   Procedure: OPEN REDUCTION INTERNAL FIXATION LEFT FIBULA  FRACTURE;  Surgeon: Nadara Mustard, MD;  Location: [facility] OR;  Service: Orthopedics;  Laterality: Left;    Prior to Admission medications   Medication Sig Start Date End Date Taking? Authorizing Provider  azelastine (ASTELIN) 0.1 % nasal spray Place 1 spray into both nostrils 2 (two) times daily.   Yes [provider]  DULoxetine (CYMBALTA) 30 MG capsule Take by mouth. 05/30/22  Yes [provider]  levocetirizine (XYZAL) 5 MG tablet Take 5 mg by mouth every evening.   Yes [provider]  levonorgestrel (MIRENA) 20 MCG/24HR IUD 1 each by Intrauterine route once.   Yes [provider]  pregabalin (LYRICA) 25 MG capsule Take by mouth. 08/25/22  Yes [provider]  albuterol (VENTOLIN HFA) 108 (90 Base) MCG/ACT inhaler Inhale 2 puffs into the lungs every 6 (six) hours as needed for wheezing or shortness of breath. Patient not taking: Reported on 07/31/2022 04/13/22   Ellsworth Lennox, PA-C  clonazePAM (KLONOPIN) 0.5 MG tablet Take 1 tablet (0.5  mg total) by mouth daily as needed for anxiety. Patient not taking: Reported on 09/04/2022 01/17/21   Willow Ora, MD  diclofenac (VOLTAREN) 75 MG EC tablet Take 1 tablet (75 mg total) by mouth 2 (two) times daily as needed. 07/31/22   Willow Ora, MD  dicyclomine (BENTYL) 20 MG tablet Take 1 tablet (20 mg total) by mouth 3 (three) times daily as needed for spasms. 07/19/22   Long, Arlyss Repress, MD  fluticasone (FLONASE SENSIMIST) 27.5 MCG/SPRAY nasal spray Place 1 spray into the nose daily as needed for allergies.    [provider]  Olopatadine HCl (PATADAY) 0.2 % SOLN 1 drop into affected eye Ophthalmic Once  a day    [provider]  ondansetron (ZOFRAN-ODT) 4 MG disintegrating tablet Take 1 tablet (4 mg total) by mouth every 8 (eight) hours as needed. 07/19/22   Long, Arlyss Repress, MD  Spacer/Aero-Holding Deretha Emory DEVI 1 Units by Does not apply route in the morning, at noon, and at bedtime. 04/13/22   Ellsworth Lennox, PA-C  zolpidem (AMBIEN) 10 MG tablet Take 0.5-1 tablets (5-10 mg total) by mouth at bedtime as needed for sleep. 12/19/21   Willow Ora, MD    Current Outpatient Medications  Medication Sig Dispense Refill   azelastine (ASTELIN) 0.1 % nasal spray Place 1 spray into both nostrils 2 (two) times daily.     DULoxetine (CYMBALTA) 30 MG capsule Take by mouth.  levocetirizine (XYZAL) 5 MG tablet Take 5 mg by mouth every evening.     levonorgestrel (MIRENA) 20 MCG/24HR IUD 1 each by Intrauterine route once.     pregabalin (LYRICA) 25 MG capsule Take by mouth.     albuterol (VENTOLIN HFA) 108 (90 Base) MCG/ACT inhaler Inhale 2 puffs into the lungs every 6 (six) hours as needed for wheezing or shortness of breath. (Patient not taking: Reported on 07/31/2022) 8 g 0   clonazePAM (KLONOPIN) 0.5 MG tablet Take 1 tablet (0.5 mg total) by mouth daily as needed for anxiety. (Patient not taking: Reported on 09/04/2022) 20 tablet 0   diclofenac (VOLTAREN) 75 MG EC tablet Take 1  tablet (75 mg total) by mouth 2 (two) times daily as needed. 60 tablet 2   dicyclomine (BENTYL) 20 MG tablet Take 1 tablet (20 mg total) by mouth 3 (three) times daily as needed for spasms. 20 tablet 0   fluticasone (FLONASE SENSIMIST) 27.5 MCG/SPRAY nasal spray Place 1 spray into the nose daily as needed for allergies.     Olopatadine HCl (PATADAY) 0.2 % SOLN 1 drop into affected eye Ophthalmic Once  a day     ondansetron (ZOFRAN-ODT) 4 MG disintegrating tablet Take 1 tablet (4 mg total) by mouth every 8 (eight) hours as needed. 20 tablet 0   Spacer/Aero-Holding Chambers DEVI 1 Units by Does not apply route in the morning, at noon, and at bedtime. 1 Units 0   zolpidem (AMBIEN) 10 MG tablet Take 0.5-1 tablets (5-10 mg total) by mouth at bedtime as needed for sleep. 30 tablet 2   Current Facility-Administered Medications  Medication Dose Route Frequency Provider Last Rate Last Admin   0.9 %  sodium chloride infusion  500 mL Intravenous Once Jenel Lucks, MD        Allergies as of 10/02/2022   (No Known Allergies)    Family History  Problem Relation Age of Onset   Arthritis Mother    Hyperlipidemia Mother    Hypertension Mother    Stroke Mother    Colon polyps Father    Arthritis Maternal Grandmother    Alcohol abuse Maternal Grandfather    Learning disabilities Daughter    Mental retardation Daughter    Seizures Daughter    Brain cancer Son 15       astrocytoma and glioblastoma   Brain cancer Other        DIPG   Colon cancer Neg Hx    Esophageal cancer Neg Hx    Rectal cancer Neg Hx    Stomach cancer Neg Hx     Social History   Socioeconomic History   Marital status: Married    Spouse name: Not on file   Number of children: Not on file   Years of education: Not on file   Highest education level: Not on file  Occupational History   Not on file  Tobacco Use   Smoking status: Former    Packs/day: 0.10    Years: 7.00    Additional pack years: 0.00    Total pack  years: 0.70    Types: Cigarettes    Quit date: 2013    Years since quitting: 11.4    Passive exposure: Never   Smokeless tobacco: Never  Vaping Use   Vaping Use: Some days   Substances: Nicotine, Flavoring  Substance and Sexual Activity   Alcohol use: Yes    Alcohol/week: 3.0 standard drinks of alcohol    Types: 3 Standard drinks  or equivalent per week   Drug use: Never   Sexual activity: Yes    Birth control/protection: I.U.D.  Other Topics Concern   Not on file  Social History Narrative   Not on file   Social Determinants of Health   Financial Resource Strain: Not on file  Food Insecurity: Not on file  Transportation Needs: Not on file  Physical Activity: Not on file  Stress: Not on file  Social Connections: Not on file  Intimate Partner Violence: Not on file    Review of Systems:  All other review of systems negative except as mentioned in the HPI.  Physical Exam: Vital signs BP 123/78   Pulse 89   Temp (!) 97.5 F (36.4 C)   Ht 5\' 1"  (1.549 m)   Wt 145 lb (65.8 kg)   SpO2 98%   BMI 27.40 kg/m   General:   Alert,  Well-developed, well-nourished, pleasant and cooperative in NAD Airway:  Mallampati 2 Lungs:  Clear throughout to auscultation.   Heart:  Regular rate and rhythm; no murmurs, clicks, rubs,  or gallops. Abdomen:  Soft, nontender and nondistended. Normal bowel sounds.   Neuro/Psych:  Normal mood and affect. A and O x 3   Tadashi Burkel E. Tomasa Rand, MD [facility]

## 2022-10-02 NOTE — Progress Notes (Signed)
VS by CW  Pt's states no medical or surgical changes since previsit or office visit.  

## 2022-10-03 ENCOUNTER — Telehealth: Payer: Self-pay | Admitting: *Deleted

## 2022-10-03 NOTE — Telephone Encounter (Signed)
No answer on  follow up call. Left message.   

## 2022-11-04 ENCOUNTER — Other Ambulatory Visit: Payer: Self-pay | Admitting: Family Medicine

## 2022-12-16 ENCOUNTER — Encounter: Payer: Self-pay | Admitting: Family Medicine

## 2022-12-16 ENCOUNTER — Other Ambulatory Visit: Payer: Self-pay

## 2022-12-16 MED ORDER — DICLOFENAC SODIUM 75 MG PO TBEC: 75.0000 mg | DELAYED_RELEASE_TABLET | Freq: Every day | ORAL | 2 refills | Status: DC

## 2023-02-04 ENCOUNTER — Encounter: Payer: Self-pay | Admitting: Family Medicine

## 2023-02-24 ENCOUNTER — Ambulatory Visit: Payer: 59 | Admitting: Family Medicine

## 2023-02-24 ENCOUNTER — Encounter: Payer: Self-pay | Admitting: Family Medicine

## 2023-02-24 VITALS — BP 122/84 | HR 98 | Temp 98.4°F | Ht 61.0 in | Wt 155.6 lb

## 2023-02-24 DIAGNOSIS — J208 Acute bronchitis due to other specified organisms: Secondary | ICD-10-CM

## 2023-02-24 DIAGNOSIS — B9689 Other specified bacterial agents as the cause of diseases classified elsewhere: Secondary | ICD-10-CM | POA: Diagnosis not present

## 2023-02-24 MED ORDER — AZITHROMYCIN 250 MG PO TABS: ORAL_TABLET | ORAL | 0 refills | Status: DC

## 2023-02-24 MED ORDER — PREDNISONE 10 MG PO TABS: ORAL_TABLET | ORAL | 0 refills | Status: DC

## 2023-02-24 MED ORDER — GUAIFENESIN-CODEINE 100-10 MG/5ML PO SOLN
5.0000 mL | Freq: Four times a day (QID) | ORAL | 0 refills | Status: DC | PRN
Start: 2023-02-24 — End: 2023-03-12

## 2023-02-24 NOTE — Progress Notes (Signed)
Subjective  CC:  Chief Complaint  Patient presents with   Cough    Pt stated that she has been having a cough/sore throat since Saturday and then the headache. COVID/FLU test were this morning and  both were negative.   Sore Throat    HPI: SUBJECTIVE:  Alison Tucker is a 47 y.o. female who complains of congestion, nasal blockage, post nasal drip, cough described as barky, dry, harsh, painful, and paroxysmal and denies sinus, high fevers, SOB, chest pain or significant GI symptoms. She has a moderate to severe frontal headache. Covid and flu testing checked today were both negative. Symptoms have been present for 4 days. She denies a history of anorexia, dizziness, vomiting and wheezing. She denies a history of asthma or COPD. Patient does not smoke cigarettes.  Assessment  1. Acute bacterial bronchitis      Plan  Discussion:  Treat for bacterial bronchitis due to severity of symptoms. Cover for atypical pathogens. Could be viral etiology as well. Education regarding differences between viral and bacterial infections and treatment options are discussed.  Supportive care measures are recommended.  We discussed the use of mucolytic's, decongestants, antihistamines and antitussives as needed.  Pred and rob ac for headache and cough.   Follow up: prn   No orders of the defined types were placed in this encounter.  Meds ordered this encounter  Medications   predniSONE (DELTASONE) 10 MG tablet    Sig: Take 4 tabs qd x 2 days, 3 qd x 2 days, 2 qd x 2d, 1qd x 3 days    Dispense:  21 tablet    Refill:  0   azithromycin (ZITHROMAX) 250 MG tablet    Sig: Take 2 tabs today, then 1 tab daily for 4 days    Dispense:  1 each    Refill:  0   guaiFENesin-codeine 100-10 MG/5ML syrup    Sig: Take 5 mLs by mouth every 6 (six) hours as needed for cough.    Dispense:  120 mL    Refill:  0      I reviewed the patients updated PMH, FH, and SocHx.  Social History: Patient  reports that she  quit smoking about 11 years ago. Her smoking use included cigarettes. She started smoking about 18 years ago. She has a 0.7 pack-year smoking history. She has never been exposed to tobacco smoke. She has never used smokeless tobacco. She reports current alcohol use of about 3.0 standard drinks of alcohol per week. She reports that she does not use drugs.  Patient Active Problem List   Diagnosis Date Noted   Family history of brain cancer 09/17/2017    Priority: High   Family history of brain stem cancer 09/17/2017    Priority: High   Adjustment disorder with anxiety 01/17/2021    Priority: Medium    Low back pain, episodic 01/17/2021    Priority: Medium    IBS (irritable bowel syndrome) 03/10/2013    Priority: Medium    Allergic rhinitis due to animal (cat) (dog) hair and dander 07/26/2021    Priority: Low   Allergic rhinitis due to pollen 07/26/2021    Priority: Low   Chronic allergic conjunctivitis 07/26/2021    Priority: Low   IUD (intrauterine device) in place - mirena 2018 09/17/2017    Priority: Low   Snoring 12/30/2021   Cervical high risk HPV (human papillomavirus) test positive 08/02/2021    Review of Systems: Cardiovascular: negative for chest pain Respiratory: negative  for SOB or hemoptysis Gastrointestinal: negative for abdominal pain Genitourinary: negative for dysuria or gross hematuria Current Meds  Medication Sig   albuterol (VENTOLIN HFA) 108 (90 Base) MCG/ACT inhaler Inhale 2 puffs into the lungs every 6 (six) hours as needed for wheezing or shortness of breath.   azelastine (ASTELIN) 0.1 % nasal spray Place 1 spray into both nostrils 2 (two) times daily.   azithromycin (ZITHROMAX) 250 MG tablet Take 2 tabs today, then 1 tab daily for 4 days   clonazePAM (KLONOPIN) 0.5 MG tablet Take 1 tablet (0.5 mg total) by mouth daily as needed for anxiety.   diclofenac (VOLTAREN) 75 MG EC tablet Take 1 tablet (75 mg total) by mouth daily.   dicyclomine (BENTYL) 20 MG  tablet Take 1 tablet (20 mg total) by mouth 3 (three) times daily as needed for spasms.   DULoxetine (CYMBALTA) 30 MG capsule Take by mouth.   fluticasone (FLONASE SENSIMIST) 27.5 MCG/SPRAY nasal spray Place 1 spray into the nose daily as needed for allergies.   guaiFENesin-codeine 100-10 MG/5ML syrup Take 5 mLs by mouth every 6 (six) hours as needed for cough.   levocetirizine (XYZAL) 5 MG tablet Take 5 mg by mouth every evening.   levonorgestrel (MIRENA) 20 MCG/24HR IUD 1 each by Intrauterine route once.   Olopatadine HCl (PATADAY) 0.2 % SOLN 1 drop into affected eye Ophthalmic Once  a day   ondansetron (ZOFRAN-ODT) 4 MG disintegrating tablet Take 1 tablet (4 mg total) by mouth every 8 (eight) hours as needed.   predniSONE (DELTASONE) 10 MG tablet Take 4 tabs qd x 2 days, 3 qd x 2 days, 2 qd x 2d, 1qd x 3 days   pregabalin (LYRICA) 25 MG capsule Take by mouth.   Spacer/Aero-Holding Chambers DEVI 1 Units by Does not apply route in the morning, at noon, and at bedtime.   zolpidem (AMBIEN) 10 MG tablet Take 0.5-1 tablets (5-10 mg total) by mouth at bedtime as needed for sleep.    Objective  Vitals: BP 122/84   Pulse 98   Temp 98.4 F (36.9 C)   Ht 5\' 1"  (1.549 m)   Wt 155 lb 9.6 oz (70.6 kg)   SpO2 93%   BMI 29.40 kg/m  General: nontoxic but ill appearing, coughing Psych:  Alert and oriented, normal mood and affect HEENT:  Normocephalic, atraumatic, supple neck, moist mucous membranes, mildly erythematous pharynx without exudate, mild lymphadenopathy, supple neck, crusting of left eye lashes Cardiovascular:  RRR without murmur. no edema Respiratory:  Good breath sounds bilaterally, CTAB with normal respiratory effort without rales, rhonchi or wheeze   Commons side effects, risks, benefits, and alternatives for medications and treatment plan prescribed today were discussed, and the patient expressed understanding of the given instructions. Patient is instructed to call or message via  MyChart if he/she has any questions or concerns regarding our treatment plan. No barriers to understanding were identified. We discussed Red Flag symptoms and signs in detail. Patient expressed understanding regarding what to do in case of urgent or emergency type symptoms.  Medication list was reconciled, printed and provided to the patient in AVS. Patient instructions and summary information was reviewed with the patient as documented in the AVS. This note was prepared with assistance of Dragon voice recognition software. Occasional wrong-word or sound-a-like substitutions may have occurred due to the inherent limitations of voice recognition software

## 2023-03-02 ENCOUNTER — Encounter: Payer: Self-pay | Admitting: Family Medicine

## 2023-03-03 ENCOUNTER — Other Ambulatory Visit: Payer: Self-pay | Admitting: Family

## 2023-03-03 MED ORDER — PREDNISONE 20 MG PO TABS: 20.0000 mg | ORAL_TABLET | Freq: Every day | ORAL | 0 refills | Status: DC

## 2023-03-11 NOTE — Telephone Encounter (Signed)
Can you please double book the 11:00am tomorrow 12/5 for this patient and confirm it with her. Or could work in on Friday. thanks

## 2023-03-12 ENCOUNTER — Ambulatory Visit: Payer: 59 | Admitting: Family Medicine

## 2023-03-12 ENCOUNTER — Ambulatory Visit
Admission: RE | Admit: 2023-03-12 | Discharge: 2023-03-12 | Disposition: A | Discharge status: Home / Self Care | Payer: 59 | Source: Ambulatory Visit | Attending: Family Medicine | Admitting: Family Medicine

## 2023-03-12 ENCOUNTER — Encounter: Payer: Self-pay | Admitting: Family Medicine

## 2023-03-12 VITALS — BP 138/88 | HR 84 | Temp 97.9°F | Ht 61.0 in | Wt 156.4 lb

## 2023-03-12 DIAGNOSIS — J208 Acute bronchitis due to other specified organisms: Secondary | ICD-10-CM

## 2023-03-12 DIAGNOSIS — R053 Chronic cough: Secondary | ICD-10-CM | POA: Diagnosis not present

## 2023-03-12 DIAGNOSIS — B9689 Other specified bacterial agents as the cause of diseases classified elsewhere: Secondary | ICD-10-CM | POA: Diagnosis not present

## 2023-03-12 DIAGNOSIS — R0602 Shortness of breath: Secondary | ICD-10-CM

## 2023-03-12 MED ORDER — HYDROCODONE BIT-HOMATROP MBR 5-1.5 MG/5ML PO SOLN: 5.0000 mL | Freq: Three times a day (TID) | ORAL | 0 refills | Status: DC | PRN

## 2023-03-12 MED ORDER — ALBUTEROL SULFATE HFA 108 (90 BASE) MCG/ACT IN AERS: 2.0000 | INHALATION_SPRAY | Freq: Four times a day (QID) | RESPIRATORY_TRACT | 0 refills | Status: DC | PRN

## 2023-03-12 MED ORDER — AMOXICILLIN-POT CLAVULANATE 875-125 MG PO TABS: 1.0000 | ORAL_TABLET | Freq: Two times a day (BID) | ORAL | 0 refills | Status: DC

## 2023-03-12 NOTE — Patient Instructions (Signed)
Please follow up if symptoms do not improve or as needed.    Please go to our [facility] office to get your xrays done. You can walk in M-F between 8:30am- noon or 1pm - 5pm. Tell them you are there for xrays ordered by me. They will send me the results, then I will let you know the results with instructions.   Address: [address].  The Xray department is located in the basement.    VISIT SUMMARY:  During today's visit, we discussed your persistent cough and the associated fatigue you have been experiencing. We reviewed your symptoms, including the nature of your cough and its impact on your sleep and daily life. We also discussed your previous treatments and their limited effectiveness.  YOUR PLAN:  -CHRONIC COUGH: A chronic cough is a cough that lasts for an extended period, often due to infections, bronchospasm, or other underlying conditions. We will order a chest x-ray to rule out any serious underlying conditions. You will be prescribed an inhaler to help with chest tightness and potential bronchospasm, another round of antibiotics due to the ongoing productive cough, and hydrocodone cough syrup to help you sleep better. Additionally, using a humidifier may provide some relief.  -FATIGUE: Fatigue is extreme tiredness, often resulting from physical or mental exertion or illness. Your fatigue is likely due to the chronic cough and disrupted sleep. Addressing the chronic cough should help improve your sleep quality. Please ensure you get plenty of rest and sleep after your weekend event.  INSTRUCTIONS:  Please follow up to review the chest x-ray results and monitor your response to the new medications and inhaler. If your symptoms persist or worsen, schedule another appointment.

## 2023-03-12 NOTE — Progress Notes (Signed)
Subjective  CC:  Chief Complaint  Patient presents with   Cough    C/) chest tighten and having a hard time to catch her breathe    HPI: Alison Tucker is a 47 y.o. female who presents to the office today to address the problems listed above in the chief complaint. F/u bronchitis from 11/19: see note  Discussed the use of AI scribe software for clinical note transcription with the patient, who gave verbal consent to proceed.  History of Present Illness   The patient, with a history of upper respiratory infection and bronchitis, presents with persistent cough and subjective sensation of chest tightness. She describes the cough as sometimes phlegmy, sometimes dry, and occasionally sounding 'creepy.' The cough is severe enough to wake her up at night, leading to significant fatigue. She has had to adjust her sleeping arrangements to avoid disturbing her partner. The cough is sometimes productive, but she denies any wheezing or shortness of breath.  The patient has tried multiple treatments, including prednisone and cough medicine with codeine, but these have not provided significant relief. She also reports a previous episode of similar symptoms at the beginning of the year, following a trip to [place] during which she was diagnosed with COVID-19, bronchitis, and an upper respiratory infection. She was treated with an inhaler at that time, which seemed to help.  The patient denies any significant anxiety contributing to her symptoms. She also denies any sinus pain, but does report some nasal congestion and occasional yellowish nasal discharge. She has not noticed any pattern to her symptoms in relation to her activities or environment. She denies any other significant health issues.      Assessment  1. SOB (shortness of breath)   2. Acute bacterial bronchitis   3. Persistent cough      Plan  Assessment and Plan    Chronic Cough Persistent cough for three weeks, with episodes of  both dry and productive cough. Reports significant fatigue and sleep disruption. No wheezing or signs of pneumonia on physical exam. Previous treatment with prednisone and codeine provided minimal relief. Differential diagnosis includes viral infection, bacterial infection, or bronchospasm. Discussed potential benefits of a chest x-ray to rule out underlying conditions. Explained that an inhaler may help with tightness and potential bronchospasm. Discussed another round of antibiotics due to ongoing productive cough. Informed about the use of a humidifier and potential benefits of hydrocodone cough syrup for better sleep. - Order chest x-ray - Prescribe inhaler with instructions on proper use albuterol - Prescribe another round of antibiotics, doxycycline - Prescribe hydrocodone cough syrup - Recommend use of a humidifier  Fatigue Severe fatigue likely secondary to chronic cough and disrupted sleep. Reports extreme tiredness and difficulty sleeping due to coughing fits. Emphasized addressing the underlying chronic cough to improve sleep quality. Encouraged rest and sleep after the weekend event. - Address underlying chronic cough to improve sleep quality - Encourage rest and sleep after the weekend event  Follow-up - Review chest x-ray results - Monitor response to new medications and inhaler - Follow up if symptoms persist or worsen.     Follow up: As scheduled 08/06/2023  Orders Placed This Encounter  Procedures   DG Chest 2 View   Meds ordered this encounter  Medications   amoxicillin-clavulanate (AUGMENTIN) 875-125 MG tablet    Sig: Take 1 tablet by mouth 2 (two) times daily.    Dispense:  14 tablet    Refill:  0   albuterol (VENTOLIN HFA)  108 (90 Base) MCG/ACT inhaler    Sig: Inhale 2 puffs into the lungs every 6 (six) hours as needed for wheezing or shortness of breath.    Dispense:  8 g    Refill:  0   HYDROcodone bit-homatropine (HYCODAN) 5-1.5 MG/5ML syrup    Sig: Take 5 mLs  by mouth every 8 (eight) hours as needed for cough.    Dispense:  120 mL    Refill:  0      I reviewed the patients updated PMH, FH, and SocHx.    Patient Active Problem List   Diagnosis Date Noted   Family history of brain cancer 09/17/2017    Priority: High   Family history of brain stem cancer 09/17/2017    Priority: High   Adjustment disorder with anxiety 01/17/2021    Priority: Medium    Low back pain, episodic 01/17/2021    Priority: Medium    IBS (irritable bowel syndrome) 03/10/2013    Priority: Medium    Snoring 12/30/2021    Priority: Low   Allergic rhinitis due to animal (cat) (dog) hair and dander 07/26/2021    Priority: Low   Allergic rhinitis due to pollen 07/26/2021    Priority: Low   Chronic allergic conjunctivitis 07/26/2021    Priority: Low   IUD (intrauterine device) in place - mirena 2018 09/17/2017    Priority: Low   Cervical high risk HPV (human papillomavirus) test positive 08/02/2021   Current Meds  Medication Sig   amoxicillin-clavulanate (AUGMENTIN) 875-125 MG tablet Take 1 tablet by mouth 2 (two) times daily.   azelastine (ASTELIN) 0.1 % nasal spray Place 1 spray into both nostrils 2 (two) times daily.   azithromycin (ZITHROMAX) 250 MG tablet Take 2 tabs today, then 1 tab daily for 4 days   clonazePAM (KLONOPIN) 0.5 MG tablet Take 1 tablet (0.5 mg total) by mouth daily as needed for anxiety.   diclofenac (VOLTAREN) 75 MG EC tablet Take 1 tablet (75 mg total) by mouth daily.   dicyclomine (BENTYL) 20 MG tablet Take 1 tablet (20 mg total) by mouth 3 (three) times daily as needed for spasms.   DULoxetine (CYMBALTA) 30 MG capsule Take by mouth.   fluticasone (FLONASE SENSIMIST) 27.5 MCG/SPRAY nasal spray Place 1 spray into the nose daily as needed for allergies.   HYDROcodone bit-homatropine (HYCODAN) 5-1.5 MG/5ML syrup Take 5 mLs by mouth every 8 (eight) hours as needed for cough.   levocetirizine (XYZAL) 5 MG tablet Take 5 mg by mouth every evening.    levonorgestrel (MIRENA) 20 MCG/24HR IUD 1 each by Intrauterine route once.   Olopatadine HCl (PATADAY) 0.2 % SOLN 1 drop into affected eye Ophthalmic Once  a day   ondansetron (ZOFRAN-ODT) 4 MG disintegrating tablet Take 1 tablet (4 mg total) by mouth every 8 (eight) hours as needed.   predniSONE (DELTASONE) 20 MG tablet Take 1 tablet (20 mg total) by mouth daily with breakfast.   Spacer/Aero-Holding Deretha Emory DEVI 1 Units by Does not apply route in the morning, at noon, and at bedtime.   zolpidem (AMBIEN) 10 MG tablet Take 0.5-1 tablets (5-10 mg total) by mouth at bedtime as needed for sleep.   [DISCONTINUED] albuterol (VENTOLIN HFA) 108 (90 Base) MCG/ACT inhaler Inhale 2 puffs into the lungs every 6 (six) hours as needed for wheezing or shortness of breath.   [DISCONTINUED] guaiFENesin-codeine 100-10 MG/5ML syrup Take 5 mLs by mouth every 6 (six) hours as needed for cough.    Allergies: Patient  has No Known Allergies. Family History: Patient family history includes Alcohol abuse in her maternal grandfather; Arthritis in her maternal grandmother and mother; Brain cancer in an other family member; Brain cancer (age of onset: 48) in her son; Colon polyps in her father; Hyperlipidemia in her mother; Hypertension in her mother; Learning disabilities in her daughter; Mental retardation in her daughter; Seizures in her daughter; Stroke in her mother. Social History:  Patient  reports that she quit smoking about 11 years ago. Her smoking use included cigarettes. She started smoking about 18 years ago. She has a 0.7 pack-year smoking history. She has never been exposed to tobacco smoke. She has never used smokeless tobacco. She reports current alcohol use of about 3.0 standard drinks of alcohol per week. She reports that she does not use drugs.  Review of Systems: Constitutional: Negative for fever malaise or anorexia Cardiovascular: negative for chest pain Respiratory: negative for SOB or persistent  cough Gastrointestinal: negative for abdominal pain  Objective  Vitals: BP 138/88   Pulse 84   Temp 97.9 F (36.6 C)   Ht 5\' 1"  (1.549 m)   Wt 156 lb 6.4 oz (70.9 kg)   SpO2 96%   BMI 29.55 kg/m  General: no acute distress , A&Ox3, occ cough HEENT: PEERL, conjunctiva normal, neck is supple, TM normal bilaterally, op clear, no LAD, no respiratory distress Cardiovascular:  RRR without murmur or gallop.  Respiratory:  Good breath sounds bilaterally, CTAB with normal respiratory effort, no wheeze rales or rhonchi Skin:  Warm, no rashes  Commons side effects, risks, benefits, and alternatives for medications and treatment plan prescribed today were discussed, and the patient expressed understanding of the given instructions. Patient is instructed to call or message via MyChart if he/she has any questions or concerns regarding our treatment plan. No barriers to understanding were identified. We discussed Red Flag symptoms and signs in detail. Patient expressed understanding regarding what to do in case of urgent or emergency type symptoms.  Medication list was reconciled, printed and provided to the patient in AVS. Patient instructions and summary information was reviewed with the patient as documented in the AVS. This note was prepared with assistance of Dragon voice recognition software. Occasional wrong-word or sound-a-like substitutions may have occurred due to the inherent limitations of voice recognition software

## 2023-03-16 ENCOUNTER — Telehealth: Payer: 59 | Admitting: Family Medicine

## 2023-03-17 ENCOUNTER — Encounter: Payer: Self-pay | Admitting: Family Medicine

## 2023-03-23 NOTE — Progress Notes (Signed)
See mychart note Dear Ms. Gieger, Just wanted to let you know that your xray showed bronchitis changes, no pneumonia.  Now stay well for the upcoming holiday! :) Sincerely, Dr. Mardelle Matte

## 2023-04-10 ENCOUNTER — Encounter: Payer: Self-pay | Admitting: Family Medicine

## 2023-04-10 ENCOUNTER — Ambulatory Visit: Payer: 59 | Admitting: Family Medicine

## 2023-04-10 VITALS — BP 124/80 | HR 96 | Temp 97.8°F | Wt 156.5 lb

## 2023-04-10 DIAGNOSIS — R0989 Other specified symptoms and signs involving the circulatory and respiratory systems: Secondary | ICD-10-CM | POA: Diagnosis not present

## 2023-04-10 MED ORDER — DOXYCYCLINE HYCLATE 100 MG PO TABS
100.0000 mg | ORAL_TABLET | Freq: Two times a day (BID) | ORAL | 0 refills | Status: DC
Start: 2023-04-10 — End: 2023-08-06

## 2023-04-10 MED ORDER — HYDROCODONE BIT-HOMATROP MBR 5-1.5 MG/5ML PO SOLN: 5.0000 mL | Freq: Three times a day (TID) | ORAL | 0 refills | Status: DC | PRN

## 2023-04-10 NOTE — Patient Instructions (Signed)
 Follow up as needed or as scheduled START the Doxycycline twice daily- take w/ food USE the cough syrup as needed Drink LOTS of fluids USE the Albuterol inhaler for cough or SOB REST!! Call with any questions or concerns Hang in there!!!

## 2023-04-10 NOTE — Progress Notes (Signed)
   Subjective:    Patient ID: Alison Tucker, female    DOB: June 18, 1975, 48 y.o.   MRN: [id number]  HPI Cough- pt was dx'd w/ bronchitis on 12/5.  Tx'd w/ Augmentin , hydrocodone  cough syrup.  Pt reports she was feeling better but sxs again worsened a few days ago.  No fever.  + chest tightness, pain (from all the coughing), sore throat.  + cough is non productive.  Frontal sinus pressure.  + HA.  No body aches.  Flu and COVID were negative.  + sick contacts- daughter had PNA.     Review of Systems For ROS see HPI     Objective:   Physical Exam Vitals reviewed.  Constitutional:      General: She is not in acute distress.    Appearance: Normal appearance. She is well-developed. She is not ill-appearing.  HENT:     Head: Normocephalic and atraumatic.     Right Ear: Tympanic membrane and ear canal normal.     Left Ear: Tympanic membrane and ear canal normal.     Nose: Congestion present. No rhinorrhea.  Eyes:     Conjunctiva/sclera: Conjunctivae normal.     Pupils: Pupils are equal, round, and reactive to light.  Cardiovascular:     Rate and Rhythm: Normal rate and regular rhythm.     Heart sounds: Normal heart sounds. No murmur heard. Pulmonary:     Effort: Pulmonary effort is normal. No respiratory distress.     Breath sounds: Rhonchi (coarse BS throughout) present.     Comments: + wet, hacking cough Musculoskeletal:     Cervical back: Normal range of motion and neck supple.  Lymphadenopathy:     Cervical: No cervical adenopathy.  Skin:    General: Skin is warm and dry.  Neurological:     General: No focal deficit present.     Mental Status: She is alert and oriented to person, place, and time.  Psychiatric:        Mood and Affect: Mood normal.        Behavior: Behavior normal.        Thought Content: Thought content normal.           Assessment & Plan:  Crackles of both lungs- new.  Pt has been treated in the last month w/ both Azithromycin  and Augmentin  (reviewed  previous office notes).  She felt the Augmentin  worked better, but since she just recently took it, will switch to Doxycycline .  Encouraged use of albuterol .  Cough syrup as needed.  Reviewed supportive care and red flags that should prompt return.  Pt expressed understanding and is in agreement w/ plan.

## 2023-04-11 ENCOUNTER — Telehealth: Payer: Self-pay | Admitting: Family Medicine

## 2023-04-11 MED ORDER — ONDANSETRON HCL 4 MG PO TABS: 4.0000 mg | ORAL_TABLET | Freq: Three times a day (TID) | ORAL | 0 refills | Status: AC | PRN

## 2023-04-11 NOTE — Telephone Encounter (Signed)
 Pt called indicating that she has nausea and vomited last night after taking the Doxycycline.  I offered to switch abx but she wants to try staying on the Doxy at this time.  Will send in Zofran to help w/ nausea.

## 2023-04-14 ENCOUNTER — Encounter: Payer: Self-pay | Admitting: Family Medicine

## 2023-05-17 ENCOUNTER — Encounter: Payer: Self-pay | Admitting: Family Medicine

## 2023-06-18 ENCOUNTER — Encounter: Payer: Self-pay | Admitting: Family Medicine

## 2023-06-18 ENCOUNTER — Encounter: Payer: Self-pay | Admitting: Plastic Surgery

## 2023-06-18 DIAGNOSIS — N6489 Other specified disorders of breast: Secondary | ICD-10-CM

## 2023-06-19 ENCOUNTER — Other Ambulatory Visit: Payer: Self-pay

## 2023-06-19 ENCOUNTER — Telehealth: Payer: Self-pay

## 2023-06-19 DIAGNOSIS — N644 Mastodynia: Secondary | ICD-10-CM

## 2023-06-19 DIAGNOSIS — Z1231 Encounter for screening mammogram for malignant neoplasm of breast: Secondary | ICD-10-CM

## 2023-06-19 NOTE — Telephone Encounter (Signed)
 Copied from CRM [id number]. Topic: Clinical - Request for Lab/Test Order >> Jun 18, 2023  2:30 PM Chantha C wrote: Reason for CRM: Patient states [facility] needs a dignostic bilateral mammogram order due to tenderness and soreness, they are trying to get patient in sooner. Please advise and call back [phone]. Patient denies any other symptoms.  Please Advise

## 2023-06-19 NOTE — Telephone Encounter (Signed)
 Order placed

## 2023-06-24 NOTE — Telephone Encounter (Signed)
 Noted.

## 2023-06-25 ENCOUNTER — Other Ambulatory Visit: Payer: Self-pay | Admitting: Family Medicine

## 2023-06-25 DIAGNOSIS — N644 Mastodynia: Secondary | ICD-10-CM

## 2023-06-25 DIAGNOSIS — Z1231 Encounter for screening mammogram for malignant neoplasm of breast: Secondary | ICD-10-CM

## 2023-07-16 LAB — HM PAP SMEAR

## 2023-08-06 ENCOUNTER — Encounter: Payer: Self-pay | Admitting: Family Medicine

## 2023-08-06 ENCOUNTER — Ambulatory Visit (INDEPENDENT_AMBULATORY_CARE_PROVIDER_SITE_OTHER): Payer: Managed Care, Other (non HMO) | Admitting: Family Medicine

## 2023-08-06 VITALS — BP 140/92 | HR 93 | Temp 97.7°F | Ht 61.0 in | Wt 152.0 lb

## 2023-08-06 DIAGNOSIS — N951 Menopausal and female climacteric states: Secondary | ICD-10-CM | POA: Insufficient documentation

## 2023-08-06 DIAGNOSIS — Z0000 Encounter for general adult medical examination without abnormal findings: Secondary | ICD-10-CM | POA: Diagnosis not present

## 2023-08-06 DIAGNOSIS — J301 Allergic rhinitis due to pollen: Secondary | ICD-10-CM

## 2023-08-06 DIAGNOSIS — N393 Stress incontinence (female) (male): Secondary | ICD-10-CM | POA: Insufficient documentation

## 2023-08-06 DIAGNOSIS — N871 Moderate cervical dysplasia: Secondary | ICD-10-CM | POA: Insufficient documentation

## 2023-08-06 DIAGNOSIS — R030 Elevated blood-pressure reading, without diagnosis of hypertension: Secondary | ICD-10-CM

## 2023-08-06 DIAGNOSIS — F4322 Adjustment disorder with anxiety: Secondary | ICD-10-CM | POA: Diagnosis not present

## 2023-08-06 DIAGNOSIS — Z9889 Other specified postprocedural states: Secondary | ICD-10-CM | POA: Diagnosis not present

## 2023-08-06 DIAGNOSIS — M545 Low back pain, unspecified: Secondary | ICD-10-CM | POA: Diagnosis not present

## 2023-08-06 DIAGNOSIS — M51369 Other intervertebral disc degeneration, lumbar region without mention of lumbar back pain or lower extremity pain: Secondary | ICD-10-CM

## 2023-08-06 LAB — CBC WITH DIFFERENTIAL/PLATELET
Basophils Absolute: 0 10*3/uL (ref 0.0–0.1)
Basophils Relative: 0.6 % (ref 0.0–3.0)
Eosinophils Absolute: 0.1 10*3/uL (ref 0.0–0.7)
Eosinophils Relative: 1.7 % (ref 0.0–5.0)
HCT: 41.8 % (ref 36.0–46.0)
Hemoglobin: 14 g/dL (ref 12.0–15.0)
Lymphocytes Relative: 34.9 % (ref 12.0–46.0)
Lymphs Abs: 2.2 10*3/uL (ref 0.7–4.0)
MCHC: 33.6 g/dL (ref 30.0–36.0)
MCV: 100.1 fl — ABNORMAL HIGH (ref 78.0–100.0)
Monocytes Absolute: 0.6 10*3/uL (ref 0.1–1.0)
Monocytes Relative: 9.5 % (ref 3.0–12.0)
Neutro Abs: 3.4 10*3/uL (ref 1.4–7.7)
Neutrophils Relative %: 53.3 % (ref 43.0–77.0)
Platelets: 310 10*3/uL (ref 150.0–400.0)
RBC: 4.17 Mil/uL (ref 3.87–5.11)
RDW: 14.7 % (ref 11.5–15.5)
WBC: 6.4 10*3/uL (ref 4.0–10.5)

## 2023-08-06 LAB — COMPREHENSIVE METABOLIC PANEL WITH GFR
ALT: 40 U/L — ABNORMAL HIGH (ref 0–35)
AST: 26 U/L (ref 0–37)
Albumin: 4.7 g/dL (ref 3.5–5.2)
Alkaline Phosphatase: 61 U/L (ref 39–117)
BUN: 12 mg/dL (ref 6–23)
CO2: 28 meq/L (ref 19–32)
Calcium: 9.5 mg/dL (ref 8.4–10.5)
Chloride: 102 meq/L (ref 96–112)
Creatinine, Ser: 0.57 mg/dL (ref 0.40–1.20)
GFR: 108.74 mL/min (ref >60.00)
Glucose, Bld: 85 mg/dL (ref 70–99)
Potassium: 4.2 meq/L (ref 3.5–5.1)
Sodium: 138 meq/L (ref 135–145)
Total Bilirubin: 0.5 mg/dL (ref 0.2–1.2)
Total Protein: 7.9 g/dL (ref 6.0–8.3)

## 2023-08-06 LAB — LIPID PANEL
Cholesterol: 294 mg/dL — ABNORMAL HIGH (ref 0–200)
HDL: 65.5 mg/dL (ref >39.00)
LDL Cholesterol: 188 mg/dL — ABNORMAL HIGH (ref 0–99)
NonHDL: 228.97
Total CHOL/HDL Ratio: 4
Triglycerides: 203 mg/dL — ABNORMAL HIGH (ref 0.0–149.0)
VLDL: 40.6 mg/dL — ABNORMAL HIGH (ref 0.0–40.0)

## 2023-08-06 MED ORDER — DICLOFENAC SODIUM 75 MG PO TBEC: 75.0000 mg | DELAYED_RELEASE_TABLET | Freq: Every day | ORAL | 1 refills | Status: AC | PRN

## 2023-08-06 NOTE — Progress Notes (Signed)
 Labs reviewed.  The 10-year ASCVD risk score (Arnett DK, et al., 2019) is: 1.5%   Values used to calculate the score:     Age: 48 years     Sex: Female     Is Non-Hispanic African American: No     Diabetic: No     Tobacco smoker: No     Systolic Blood Pressure: 140 mmHg     Is BP treated: No     HDL Cholesterol: 65.5 mg/dL     Total Cholesterol: 294 mg/dL  Dear Alison Tucker, Thank you for allowing me to care for you at your recent office visit.  I wanted to let you know that I have reviewed your lab test results and am happy to report that they are mostly good: normal sugar, kidney, and blood counts. However, your cholesterol levels are very elevated this year. I recommend working on improving your diet if needed, but also recommend starting a cholesterol lowering medication called a statin. Your blood pressure was mildly elevated at the visit as well.  The numbers have been trending upwards over time.  Please consider eating low fat, low salt diet and returning in 3-6 months fasting for an office visit to recheck your levels and discuss treatment if needed.   Sincerely, Dr. Jonelle Neri

## 2023-08-06 NOTE — Patient Instructions (Signed)
 Please return in 12 months for your annual complete physical; please come fasting.   I will release your lab results to you on your MyChart account with further instructions. You may see the results before I do, but when I review them I will send you a message with my report or have my assistant call you if things need to be discussed. Please reply to my message with any questions. Thank you!   If you have any questions or concerns, please don't hesitate to send me a message via MyChart or call the office at [phone]. Thank you for visiting with us  today! It's our pleasure caring for you.    VISIT SUMMARY: Today, you came in for a routine wellness visit. We discussed your ongoing issues with hot flashes, back pain, and urinary incontinence. We also reviewed your recent and upcoming screenings, including your mammogram and ultrasound for right breast pain. Your recent blood work confirmed that you are menopausal. We also talked about your current medications and supplements, including your use of Cymbalta for mood management and your recent start of a multivitamin and B12.  YOUR PLAN: -BACK PAIN: Chronic back pain is persistent pain in the back that can be due to various causes. You are currently managing it with diclofenac  and cyclobenzaprine, and you received bilateral steroid injections recently. We discussed the risks of long-term NSAID use and encouraged you to try alternative pain management strategies, such as stretching exercises. We will monitor your renal function due to NSAID use.  -HOT FLASHES: Hot flashes are sudden feelings of warmth, often associated with menopause. Your blood work confirmed that you are menopausal. We will discuss hormone therapy options with your OB/GYN to help manage your symptoms. Estrogen therapy may be considered if appropriate.  -URINARY INCONTINENCE: Urinary incontinence is the loss of bladder control, often triggered by sneezing, coughing, or exertion. We  discussed the potential use of pseudoephedrine for mild symptoms and the importance of regular Kegel exercises to strengthen your pelvic floor muscles.  -GENERAL HEALTH MAINTENANCE: We discussed the importance of calcium supplementation for bone health post-menopause. You should take 1200 mg of calcium daily, preferably with Caltrate Plus. Continue taking your multivitamin and B12 supplements for overall health.  INSTRUCTIONS: Please follow up with your OB/GYN to discuss hormone therapy options for your hot flashes. Complete your scheduled mammogram and ultrasound for your right breast pain. We will monitor your blood work results and communicate any updates via MyChart. Please schedule your next annual wellness visit unless any issues arise before then.                      Contains text generated by [employer]bridge.                                 Contains text generated by Abridge.

## 2023-08-06 NOTE — Progress Notes (Signed)
 Subjective  Chief Complaint  Patient presents with   Annual Exam    Pt here for Annual exam and is not currently fasting     HPI: Alison Tucker is a 48 y.o. female who presents to [facility] [place] today for a Female Wellness Visit. She also has the concerns and/or needs as listed above in the chief complaint. These will be addressed in addition to the Health Maintenance Visit.   Wellness Visit: annual visit with health maintenance review and exam  HM: sees gyn; I reviewed multiple records: ascus + HPV pap 2024; colpo 09/2022 CIN 2/3; LEEP 12/2022; pt reports had normal f/u pap. Will request records. Will repeat pap in 1 year. IUD in place and reports hot flushes and elevated FSH: to discuss management strategies with gyn today. Disruptive nightly.  Chronic disease f/u and/or acute problem visit: (deemed necessary to be done in addition to the wellness visit): Discussed the use of AI scribe software for clinical note transcription with the patient, who gave verbal consent to proceed.  History of Present Illness Alison Tucker is a 48 year old female who presents with hot flashes and back pain.  She experiences significant hot flashes, particularly bothersome at night, affecting her sleep. She uses a cooling pad and sets the room temperature to 62 degrees to manage symptoms at home, but finds it difficult to sleep when traveling due to sweating. Recent blood work has confirmed her menopausal status.  She has a history of chronic back pain, persisting despite previous surgery. She received bilateral steroid injections approximately three weeks ago. Her work in a clinic requires frequent standing, exacerbating her back pain. She takes cyclobenzaprine and diclofenac  at night to manage the pain, which worsens in the late afternoon and evening. She also uses tramadol  occasionally for severe pain and has tried Advil Dual Action for additional relief. Reviewed NS notes/s/p lumbar  surgery last year.  Remains on cymbalta per pain mgt.   Stress urinary incontinence w/o oab sxs: She experiences urinary incontinence, particularly when sneezing or laughing.   She is scheduled for a mammogram and ultrasound this month due to pain in her right breast, with a follow-up with her plastic surgeon depending on the results. She had a colonoscopy last year, which was normal. She has a history of colposcopy and LEEP procedures, with a recent normal Pap smear. She is currently using a Mirena IUD.     Assessment  1. Annual physical exam   2. High grade squamous intraepithelial lesion (HGSIL), grade 2 CIN, on biopsy of cervix   3. Adjustment disorder with anxiety   4. Low back pain, episodic   5. History of loop electrical excision procedure (LEEP)   6. Vasomotor symptoms due to menopause   7. Primary stress urinary incontinence   8. Seasonal allergic rhinitis due to pollen   9. Elevated blood pressure reading without diagnosis of hypertension   10. Degeneration of intervertebral disc of lumbar region without discogenic back pain or lower extremity pain      Plan  Female Wellness Visit: Age appropriate Health Maintenance and Prevention measures were discussed with patient. Included topics are cancer screening recommendations, ways to keep healthy (see AVS) including dietary and exercise recommendations, regular eye and dental care, use of seat belts, and avoidance of moderate alcohol use and tobacco use. Screens are current w/ mammo this month. BMI: discussed patient's BMI and encouraged positive lifestyle modifications to help get to or maintain a target BMI.  HM needs and immunizations were addressed and ordered. See below for orders. See HM and immunization section for updates. Routine labs and screening tests ordered including cmp, cbc and lipids where appropriate. Discussed recommendations regarding Vit D and calcium supplementation (see AVS)  Chronic disease management visit  and/or acute problem visit: Assessment and Plan Assessment & Plan Wellness Visit Routine wellness visit. Recent colposcopy and LEEP with normal follow-up Pap smear. Mammogram scheduled with ultrasound for right breast pain. Colonoscopy normal last year. Upcoming OB/GYN appointments for hot flashes and hormone therapy. - Request OB/GYN records post-LEEP. - Monitor mammogram and ultrasound results. - Continue routine screenings.  Back pain Chronic back pain managed with diclofenac  and cyclobenzaprine. Bilateral steroid injections provided some relief. Discussed risks of long-term NSAID use and encouraged alternative pain management strategies. - Refill diclofenac  as needed. - Encourage stretching exercises. - Consider alternative pain management to reduce NSAID use. - Monitor renal function due to NSAID use.  Hot flashes Significant hot flashes affecting quality of life. Blood work indicates menopausal status. Meeting with OB/GYN to discuss hormone therapy options. - Discuss hormone therapy options with OB/GYN. - Consider estrogen therapy if appropriate.  Urinary incontinence Stress urinary incontinence triggered by sneezing, coughing, and exertion. No prolapse. Discussed potential use of pseudoephedrine and importance of Kegel exercises. - Consider pseudoephedrine for mild incontinence symptoms. - Encourage regular Kegel exercises.  Anxiety managed with Cymbalta. Mood is well-managed.  Allergic rhinitis Allergic rhinitis managed with nasal spray and levocetirizine. No asthma present.  General Health Maintenance Discussed importance of calcium supplementation for bone health post-menopause. Started multivitamin and B12 for general health. - Recommend calcium supplementation, 1200 mg daily, preferably with Caltrate Plus. - Continue multivitamin and B12 supplementation.  Follow-up Follow-up plans for various health concerns and screenings. - Follow up with OB/GYN for hormone therapy  discussion. - Complete mammogram and ultrasound. - Monitor blood work results and communicate via MyChart. - Annual wellness visit unless issues arise.   Follow up: 12 mo for cpe  Orders Placed This Encounter  Procedures   Lipid panel   Comprehensive metabolic panel with GFR   CBC with Differential/Platelet   Meds ordered this encounter  Medications   diclofenac  (VOLTAREN ) 75 MG EC tablet    Sig: Take 1 tablet (75 mg total) by mouth daily as needed.    Dispense:  90 tablet    Refill:  1      Body mass index is 28.72 kg/m. Wt Readings from Last 3 Encounters:  08/06/23 152 lb (68.9 kg)  04/10/23 156 lb 8 oz (71 kg)  03/12/23 156 lb 6.4 oz (70.9 kg)     Patient Active Problem List   Diagnosis Date Noted Date Diagnosed   High grade squamous intraepithelial lesion (HGSIL), grade 2 CIN, on biopsy of cervix 08/06/2023     Priority: High    ASCUS pap w/ HR HPV 2024; colpo with HSIL/cin2 at 12:00 09/2022;  LEEP [facility], f/u Pap normal; repeat 1 year. Dr. Cathryn Cobb    History of loop electrical excision procedure (LEEP) 08/06/2023     Priority: High   Lumbar radiculopathy 06/22/2022     Priority: High   Family history of brain cancer 09/17/2017     Priority: High    Son, negative genetic screening    Family history of brain stem cancer 09/17/2017     Priority: High    Sister's daughter; neg genetic testing    Vasomotor symptoms due to menopause 08/06/2023  Priority: Medium    Degenerative disc disease, lumbar 09/15/2022     Priority: Medium     Cymbalta for pain management    Foraminal stenosis of lumbar region 09/15/2022     Priority: Medium    Cervical high risk HPV (human papillomavirus) test positive 08/02/2021     Priority: Medium     Nl pap, 07/2021; repeat 07/2022.Repeat pap and cotest with hpv in 1 year. 5 year fisk of cin3+ is 4.8% ASCCP guidelines.  07/2022: + HR HPV and ASC-US ; 5 year risk of cin3+ 5.4%: refer for colposcopy.     Adjustment disorder with anxiety 01/17/2021     Priority: Medium    Low back pain, episodic 01/17/2021     Priority: Medium     Lumbar radiculopathy 2024: cymbalta for pain control started Lumbar spine MRI: IMPRESSION:  L5-S1 posterior central disc protrusion is abutting the bilateral transversing S1 nerve roots, facet arthropathy at other levels.    IBS (irritable bowel syndrome) 03/10/2013     Priority: Medium    Primary stress urinary incontinence 08/06/2023     Priority: Low   Snoring 12/30/2021     Priority: Low    Pulm eval 2023; negative home sleep study    Allergic rhinitis due to animal hair and dander 07/26/2021     Priority: Low   Allergic rhinitis due to pollen 07/26/2021     Priority: Low   Chronic allergic conjunctivitis 07/26/2021     Priority: Low   IUD (intrauterine device) in place - mirena 2018 09/17/2017     Priority: Low   Health Maintenance  Topic Date Due   COVID-19 Vaccine (7 - 2024-25 season) 08/22/2023 (Originally 12/07/2022)   DTaP/Tdap/Td (3 - Td or Tdap) 08/19/2023   MAMMOGRAM  08/22/2023   INFLUENZA VACCINE  11/06/2023   Cervical Cancer Screening (Pap smear)  07/30/2025   Colonoscopy  10/01/2032   Hepatitis C Screening  Completed   HIV Screening  Completed   HPV VACCINES  Aged Out   Meningococcal B Vaccine  Aged Out   Immunization History  Administered Date(s) Administered   Influenza Inj Mdck Quad With Preservative 12/20/2020   Influenza, High Dose Seasonal PF 03/09/2019   Influenza, Quadrivalent, Recombinant, Inj, Pf 01/18/2018   Influenza,inj,Quad PF,6+ Mos 11/22/2021   Influenza-Unspecified 02/04/2023   MMR 08/18/2013, 08/18/2013   PFIZER(Purple Top)SARS-COV-2 Vaccination 06/19/2019, 07/11/2019, 08/18/2019, 03/10/2020   Pfizer Covid-19 Vaccine Bivalent Booster 46yrs & up 12/26/2020, 12/21/2021   Tdap 01/02/2009, 08/18/2013   Varicella 08/18/2013   We updated and reviewed the patient's past history in detail and it is documented  below. Allergies: Patient has no known allergies. Past Medical History Patient  has a past medical history of Anxiety, Chicken pox, Closed nondisplaced fracture of lateral malleolus of left fibula, Depression, Family history of brain cancer (09/17/2017), Family history of brain stem cancer (09/17/2017), and IUD (intrauterine device) in place - mirena 2018 (09/17/2017). Past Surgical History Patient  has a past surgical history that includes Cosmetic surgery (2010); Augmentation mammaplasty (Bilateral, 2010); and ORIF ankle fracture (Left, 10/23/2021). Family History: Patient family history includes Alcohol abuse in her maternal grandfather; Arthritis in her maternal grandmother and mother; Brain cancer in an other family member; Brain cancer (age of onset: 41) in her son; Colon polyps in her father; Hyperlipidemia in her mother; Hypertension in her mother; Learning disabilities in her daughter; Mental retardation in her daughter; Seizures in her daughter; Stroke in her mother. Social History:  Patient  reports that  she quit smoking about 12 years ago. Her smoking use included cigarettes. She started smoking about 19 years ago. She has a 0.7 pack-year smoking history. She has never been exposed to tobacco smoke. She has never used smokeless tobacco. She reports current alcohol use of about 3.0 standard drinks of alcohol per week. She reports that she does not use drugs.  Review of Systems: Constitutional: negative for fever or malaise Ophthalmic: negative for photophobia, double vision or loss of vision Cardiovascular: negative for chest pain, dyspnea on exertion, or new LE swelling Respiratory: negative for SOB or persistent cough Gastrointestinal: negative for abdominal pain, change in bowel habits or melena Genitourinary: negative for dysuria or gross hematuria, no abnormal uterine bleeding or disharge Musculoskeletal: negative for new gait disturbance or muscular weakness Integumentary:  negative for new or persistent rashes, no breast lumps Neurological: negative for TIA or stroke symptoms Psychiatric: negative for SI or delusions Allergic/Immunologic: negative for hives  Patient Care Team    Relationship Specialty Notifications Start End  Luevenia Saha, MD PCP - General Family Medicine  09/17/17   Seabron Cypress, Timmothy Foots, MD Consulting Physician Allergy and Immunology  08/23/19   Grace Laura, MD Consulting Physician Obstetrics and Gynecology  08/06/23   Pamella Boer, MD Referring Physician Neurosurgery  08/06/23   Elois Hair, MD Consulting Physician Gastroenterology  08/06/23   Janeece Mechanic, MD Consulting Physician Pain Medicine  08/06/23     Objective  Vitals: BP (!) 140/92   Pulse 93   Temp 97.7 F (36.5 C)   Ht 5\' 1"  (1.549 m)   Wt 152 lb (68.9 kg)   SpO2 97%   BMI 28.72 kg/m  General:  Well developed, well nourished, no acute distress  Psych:  Alert and orientedx3,normal mood and affect HEENT:  Normocephalic, atraumatic, non-icteric sclera,  supple neck without adenopathy, mass or thyromegaly Cardiovascular:  Normal S1, S2, RRR without gallop, rub or murmur Respiratory:  Good breath sounds bilaterally, CTAB with normal respiratory effort Gastrointestinal: normal bowel sounds, soft, non-tender, no noted masses. No HSM MSK: extremities without edema, joints without erythema or swelling Neurologic:    Mental status is normal.  Gross motor and sensory exams are normal.  No tremor  Commons side effects, risks, benefits, and alternatives for medications and treatment plan prescribed today were discussed, and the patient expressed understanding of the given instructions. Patient is instructed to call or message via MyChart if he/she has any questions or concerns regarding our treatment plan. No barriers to understanding were identified. We discussed Red Flag symptoms and signs in detail. Patient expressed understanding regarding what to do in case of  urgent or emergency type symptoms.  Medication list was reconciled, printed and provided to the patient in AVS. Patient instructions and summary information was reviewed with the patient as documented in the AVS. This note was prepared with assistance of Dragon voice recognition software. Occasional wrong-word or sound-a-like substitutions may have occurred due to the inherent limitations of voice recognition software

## 2023-08-07 MED ORDER — ROSUVASTATIN CALCIUM 5 MG PO TABS: 5.0000 mg | ORAL_TABLET | Freq: Every evening | ORAL | 3 refills | Status: AC

## 2023-08-27 ENCOUNTER — Ambulatory Visit
Admission: RE | Admit: 2023-08-27 | Discharge: 2023-08-27 | Disposition: A | Discharge status: Home / Self Care | Source: Ambulatory Visit | Attending: Family Medicine | Admitting: Family Medicine

## 2023-08-27 DIAGNOSIS — N644 Mastodynia: Secondary | ICD-10-CM

## 2023-08-27 DIAGNOSIS — Z1231 Encounter for screening mammogram for malignant neoplasm of breast: Secondary | ICD-10-CM

## 2023-09-11 ENCOUNTER — Encounter: Payer: Self-pay | Admitting: Family Medicine

## 2023-09-11 ENCOUNTER — Ambulatory Visit: Admitting: Family Medicine

## 2023-09-11 VITALS — BP 150/104 | HR 91 | Temp 98.1°F | Ht 61.0 in | Wt 150.0 lb

## 2023-09-11 DIAGNOSIS — E782 Mixed hyperlipidemia: Secondary | ICD-10-CM | POA: Insufficient documentation

## 2023-09-11 DIAGNOSIS — E663 Overweight: Secondary | ICD-10-CM | POA: Diagnosis not present

## 2023-09-11 DIAGNOSIS — R030 Elevated blood-pressure reading, without diagnosis of hypertension: Secondary | ICD-10-CM | POA: Diagnosis not present

## 2023-09-11 MED ORDER — TIRZEPATIDE-WEIGHT MANAGEMENT 2.5 MG/0.5ML ~~LOC~~ SOLN: 2.5000 mg | SUBCUTANEOUS | 1 refills | Status: DC

## 2023-09-11 NOTE — Patient Instructions (Signed)
 Please return in 8 weeks for recheck blood pressure and weight  If you have any questions or concerns, please don't hesitate to send me a message via MyChart or call the office at [phone]. Thank you for visiting with us  today! It's our pleasure caring for you.    VISIT SUMMARY: Today, we discussed your upcoming surgery, muscle spasms, elevated blood pressure, and overall health. We reviewed your symptoms and made a plan to address each issue to ensure you are in the best possible health for your surgery.  YOUR PLAN: -BREAST IMPLANT REMOVAL AND MASTOPEXY: You are scheduled for surgery on September 2nd to remove your breast implants and perform a lift. This decision was made to alleviate your neck discomfort and improve your posture. We will proceed with the scheduled surgery.  -HYPERTENSION: Hypertension means high blood pressure. Your blood pressure has been elevated at multiple visits. We recommend monitoring your blood pressure at home. If it does not stabilize below 140/90 mmHg, ideally under 130/80 mmHg, before your surgery, we may consider starting you on antihypertensive medication.  -MUSCLE SPASMS AND CRAMPS: You have been experiencing muscle spasms and cramps, particularly in your feet and hands, which have worsened since starting Crestor . These symptoms are not typical for statin use. We recommend discontinuing Crestor  for several weeks to see if your symptoms improve. If they do, we will consider an alternative cholesterol medication.  -OVERWEIGHT: Your BMI is approximately 28, which classifies you as overweight. We discussed the benefits of weight loss for your overall health and surgery preparation. We will start you on Zepbound, a medication to aid in weight loss. Begin with 2.5 mg weekly for the first month, then increase to 5 mg if tolerated. We will follow up in August to assess your progress and blood pressure.  -GENERAL HEALTH MAINTENANCE: We are focusing on improving your  health before surgery, including weight loss and blood pressure management. Please confirm your tetanus vaccination status and update it if necessary.  INSTRUCTIONS: Please monitor your blood pressure at home and keep a record of your readings. Discontinue Crestor  for several weeks and observe if your muscle spasms improve. Start taking Zepbound at 2.5 mg weekly for the first month, then increase to 5 mg if tolerated. Schedule a follow-up appointment in August to assess your progress and blood pressure. Confirm your tetanus vaccination status and update it if necessary.                      Contains text generated by [employer]bridge.                                 Contains text generated by Abridge.

## 2023-09-11 NOTE — Progress Notes (Signed)
 Subjective  CC:  Chief Complaint  Patient presents with   Hyperlipidemia    Pt stated that she believes that the Crestor  is making her have muscle cramps more and it has been affecting her hands   Weight Loss    HPI: Alison Tucker is a 48 y.o. female who presents to the office today to address the problems listed above in the chief complaint. Discussed the use of AI scribe software for clinical note transcription with the patient, who gave verbal consent to proceed.  History of Present Illness Alison Tucker is a 48 year old female who presents for a pre-operative evaluation and management of muscle spasms and elevated blood pressure.  She is scheduled for surgery on September 2nd to remove breast implants and perform a lift due to neck discomfort and a sensation of getting shorter over time. I reviewed plastics evaluation.  She experiences muscle spasms and cramps, particularly in her feet and hands, which have worsened since starting Crestor . These spasms occur throughout the day, even before getting out of bed. An episode involved her hand cramping, causing two fingers to stick together, which resolved after some time. No significant achiness is noted, and she stays hydrated, drinking mostly water.  Her blood pressure has been elevated at recent medical visits, including a gynecologist appointment on May 1st and a visit to a Engineer, petroleum. She attributes some of the elevation to stress and excitement about her upcoming surgery. She has been actively working on lifestyle changes, including exercising, losing weight, and monitoring her diet to improve her health before surgery. She has a family history of high blood pressure, as her mother also has the condition. She is concerned about her cholesterol and blood pressure management, especially in the context of her upcoming surgery.  No sleep apnea. She has a history of IBS, which she manages, and is aware that it might affect her  response to medications.  Struggles with weight loss: wants to be at ideal bmi prior to breast surgery. Diet has improved. Now exercising again.   Assessment  1. Overweight (BMI 25.0-29.9)   2. Mixed hyperlipidemia   3. Elevated blood pressure reading without diagnosis of hypertension      Plan  Assessment and Plan Assessment & Plan Overweight: -discussed options -trial of zepbound. Education on use and side effects and expectations and nutrition counseling done.  Breast implant removal and mastopexy Scheduled for surgery on September 2nd. Opted against reimplantation due to concerns about complications and symptoms affecting neck and posture. - Proceed with scheduled surgery on September 2nd.  Hypertension Blood pressure elevated at multiple visits despite lifestyle changes. Family history of hypertension noted. - Monitor blood pressure at home. - Consider antihypertensive medication if blood pressure does not stabilize under 140/90 mmHg, ideally under 130/80 mmHg, before surgery.  Muscle spasms and cramps Muscle spasms and cramps, particularly in feet and hands, worsened since starting Crestor . Symptoms atypical for statin use. - Discontinue Crestor  for several weeks to assess improvement. - Consider alternative cholesterol medication if symptoms resolve.  Overweight BMI approximately 28. Interested in weight loss for health improvement and surgery preparation. Discussed GLP-1 agonists for weight loss and metabolic benefits. - Prescribe Zepbound via [employer] for weight loss. - Start with 2.5 mg weekly for the first month, then increase to 5 mg if tolerated. - Schedule follow-up appointment in August to assess progress and blood pressure.  General Health Maintenance Focused on improving health before surgery, including weight loss and blood  pressure management. - Confirm tetanus vaccination status and update if necessary.    Follow up: 8 weeks for recheck Orders  Placed This Encounter  Procedures   HM PAP SMEAR   Meds ordered this encounter  Medications   tirzepatide (ZEPBOUND) 2.5 MG/0.5ML injection vial    Sig: Inject 2.5 mg into the skin once a week.    Dispense:  2 mL    Refill:  1     I reviewed the patients updated PMH, FH, and SocHx.  Patient Active Problem List   Diagnosis Date Noted   High grade squamous intraepithelial lesion (HGSIL), grade 2 CIN, on biopsy of cervix 08/06/2023    Priority: High   History of loop electrical excision procedure (LEEP) 08/06/2023    Priority: High   Lumbar radiculopathy 06/22/2022    Priority: High   Family history of brain cancer 09/17/2017    Priority: High   Family history of brain stem cancer 09/17/2017    Priority: High   Vasomotor symptoms due to menopause 08/06/2023    Priority: Medium    Degenerative disc disease, lumbar 09/15/2022    Priority: Medium    Foraminal stenosis of lumbar region 09/15/2022    Priority: Medium    Cervical high risk HPV (human papillomavirus) test positive 08/02/2021    Priority: Medium    Adjustment disorder with anxiety 01/17/2021    Priority: Medium    Low back pain, episodic 01/17/2021    Priority: Medium    IBS (irritable bowel syndrome) 03/10/2013    Priority: Medium    Primary stress urinary incontinence 08/06/2023    Priority: Low   Snoring 12/30/2021    Priority: Low   Allergic rhinitis due to animal hair and dander 07/26/2021    Priority: Low   Allergic rhinitis due to pollen 07/26/2021    Priority: Low   Chronic allergic conjunctivitis 07/26/2021    Priority: Low   IUD (intrauterine device) in place - mirena 2018 09/17/2017    Priority: Low   Mixed hyperlipidemia 09/11/2023   Overweight (BMI 25.0-29.9) 09/11/2023   Current Meds  Medication Sig   azelastine (ASTELIN) 0.1 % nasal spray Place 1 spray into both nostrils 2 (two) times daily.   clonazePAM  (KLONOPIN ) 0.5 MG tablet Take 1 tablet (0.5 mg total) by mouth daily as needed for  anxiety.   diclofenac  (VOLTAREN ) 75 MG EC tablet Take 1 tablet (75 mg total) by mouth daily as needed.   dicyclomine  (BENTYL ) 20 MG tablet Take 1 tablet (20 mg total) by mouth 3 (three) times daily as needed for spasms.   DULoxetine (CYMBALTA) 30 MG capsule Take by mouth.   fluticasone (FLONASE SENSIMIST) 27.5 MCG/SPRAY nasal spray Place 1 spray into the nose daily as needed for allergies.   levocetirizine (XYZAL) 5 MG tablet Take 5 mg by mouth every evening.   levonorgestrel (MIRENA) 20 MCG/24HR IUD 1 each by Intrauterine route once.   Olopatadine HCl (PATADAY) 0.2 % SOLN 1 drop into affected eye Ophthalmic Once  a day   ondansetron  (ZOFRAN ) 4 MG tablet Take 1 tablet (4 mg total) by mouth every 8 (eight) hours as needed for nausea or vomiting.   rosuvastatin  (CRESTOR ) 5 MG tablet Take 1 tablet (5 mg total) by mouth at bedtime.   tirzepatide (ZEPBOUND) 2.5 MG/0.5ML injection vial Inject 2.5 mg into the skin once a week.   zolpidem  (AMBIEN ) 10 MG tablet Take 0.5-1 tablets (5-10 mg total) by mouth at bedtime as needed for sleep.  Allergies: Patient has no known allergies. Family History: Patient family history includes Alcohol abuse in her maternal grandfather; Arthritis in her maternal grandmother and mother; Brain cancer in an other family member; Brain cancer (age of onset: 78) in her son; Colon polyps in her father; Hyperlipidemia in her mother; Hypertension in her mother; Learning disabilities in her daughter; Mental retardation in her daughter; Seizures in her daughter; Stroke in her mother. Social History:  Patient  reports that she quit smoking about 12 years ago. Her smoking use included cigarettes. She started smoking about 19 years ago. She has a 0.7 pack-year smoking history. She has never been exposed to tobacco smoke. She has never used smokeless tobacco. She reports current alcohol use of about 3.0 standard drinks of alcohol per week. She reports that she does not use drugs.  Review  of Systems: Constitutional: Negative for fever malaise or anorexia Cardiovascular: negative for chest pain Respiratory: negative for SOB or persistent cough Gastrointestinal: negative for abdominal pain  Objective  Vitals: BP (!) 150/104   Pulse 91   Temp 98.1 F (36.7 C)   Ht 5\' 1"  (1.549 m)   Wt 150 lb (68 kg)   SpO2 99%   BMI 28.34 kg/m  General: no acute distress , A&Ox3  Commons side effects, risks, benefits, and alternatives for medications and treatment plan prescribed today were discussed, and the patient expressed understanding of the given instructions. Patient is instructed to call or message via MyChart if he/she has any questions or concerns regarding our treatment plan. No barriers to understanding were identified. We discussed Red Flag symptoms and signs in detail. Patient expressed understanding regarding what to do in case of urgent or emergency type symptoms.  Medication list was reconciled, printed and provided to the patient in AVS. Patient instructions and summary information was reviewed with the patient as documented in the AVS. This note was prepared with assistance of Dragon voice recognition software. Occasional wrong-word or sound-a-like substitutions may have occurred due to the inherent limitations of voice recognition software

## 2023-09-25 ENCOUNTER — Encounter: Payer: Self-pay | Admitting: Family Medicine

## 2023-09-28 MED ORDER — HYDROCORTISONE ACETATE 30 MG RE SUPP: RECTAL | 0 refills | Status: DC

## 2023-09-29 MED ORDER — HYDROCORTISONE ACETATE 25 MG RE SUPP: 25.0000 mg | Freq: Two times a day (BID) | RECTAL | 0 refills | Status: AC

## 2023-09-29 NOTE — Addendum Note (Signed)
 Addended by: JODIE GAMMONS on: 09/29/2023 03:22 PM   Modules accepted: Orders

## 2023-11-06 ENCOUNTER — Ambulatory Visit: Admitting: Family Medicine

## 2023-11-06 ENCOUNTER — Encounter: Payer: Self-pay | Admitting: Family Medicine

## 2023-11-06 VITALS — BP 144/86 | HR 88 | Temp 97.9°F | Ht 61.0 in | Wt 148.8 lb

## 2023-11-06 DIAGNOSIS — E663 Overweight: Secondary | ICD-10-CM

## 2023-11-06 DIAGNOSIS — I10 Essential (primary) hypertension: Secondary | ICD-10-CM

## 2023-11-06 MED ORDER — VALSARTAN-HYDROCHLOROTHIAZIDE 80-12.5 MG PO TABS: 1.0000 | ORAL_TABLET | Freq: Every day | ORAL | 3 refills | Status: AC

## 2023-11-06 NOTE — Progress Notes (Signed)
 Subjective  CC:  Chief Complaint  Patient presents with   Hypertension    HPI: Alison Tucker is a 48 y.o. female who presents to the office today to address the problems listed above in the chief complaint. Blood pressure follow-up: Elevated recently.  She has started checking at home and confirms.  Blood pressures averaging 140s over 80s to 90s.  If she is stressed he goes a little bit higher.  She otherwise feels well.  She does admit that a toxic work environment is stressing her out and is likely contributing to the rise in blood pressure.  She has no chest pain shortness of breath palpitations or lower extremity edema.  High blood pressure does run in the family.  She is concerned because she is scheduled for surgery in a few weeks, breast reduction/removal of implants and left with plastic surgery and needs her blood pressure to be well-controlled at that time. Obesity: She was never able to get Zepbound  approved.  She is still interested in GLP-1 therapies.  She is working hard on healthy diet and exercise  Assessment  1. Essential hypertension   2. Overweight (BMI 25.0-29.9)      Plan   Hypertension f/u: New diagnosis.  Discuss prognosis and need for good control.  Due to her upcoming surgery, we will treat aggressively with valsartan/HCTZ 8012.5.  Discussed the risk of pushing blood pressure too low and discussed symptoms of hypotension.  She has home blood pressure monitoring and will follow-up with me via MyChart if there is any concerns about blood pressure readings.  She has follow-up with me in 2 weeks.  At that time we will check renal function and electrolytes.  Low-salt diet.  Goal blood pressure less than 140/90 Overweight: discussed options   Education regarding management of these chronic disease states was given. Management strategies discussed on successive visits include dietary and exercise recommendations, goals of achieving and maintaining IBW, and lifestyle  modifications aiming for adequate sleep and minimizing stressors.   Follow up: 2 weeks as scheduled  No orders of the defined types were placed in this encounter.  Meds ordered this encounter  Medications   valsartan-hydrochlorothiazide (DIOVAN-HCT) 80-12.5 MG tablet    Sig: Take 1 tablet by mouth daily.    Dispense:  90 tablet    Refill:  3      BP Readings from Last 3 Encounters:  11/06/23 (!) 144/86  09/11/23 (!) 150/104  08/06/23 (!) 140/92   Wt Readings from Last 3 Encounters:  11/06/23 148 lb 12.8 oz (67.5 kg)  09/11/23 150 lb (68 kg)  08/06/23 152 lb (68.9 kg)    Lab Results  Component Value Date   CHOL 294 (H) 08/06/2023   CHOL 234 (H) 07/31/2022   CHOL 234 (H) 07/26/2021   Lab Results  Component Value Date   HDL 65.50 08/06/2023   HDL 45.50 07/31/2022   HDL 45.50 07/26/2021   Lab Results  Component Value Date   LDLCALC 188 (H) 08/06/2023   LDLCALC 157 (H) 07/31/2022   LDLCALC 136 (H) 09/20/2018   Lab Results  Component Value Date   TRIG 203.0 (H) 08/06/2023   TRIG 157.0 (H) 07/31/2022   TRIG 218.0 (H) 07/26/2021   Lab Results  Component Value Date   CHOLHDL 4 08/06/2023   CHOLHDL 5 07/31/2022   CHOLHDL 5 07/26/2021   Lab Results  Component Value Date   LDLDIRECT 171.0 07/26/2021   Lab Results  Component Value Date   CREATININE  0.57 08/06/2023   BUN 12 08/06/2023   NA 138 08/06/2023   K 4.2 08/06/2023   CL 102 08/06/2023   CO2 28 08/06/2023    The 10-year ASCVD risk score (Arnett DK, et al., 2019) is: 2.1%   Values used to calculate the score:     Age: 24 years     Clincally relevant sex: Female     Is Non-Hispanic African American: No     Diabetic: No     Tobacco smoker: No     Systolic Blood Pressure: 144 mmHg     Is BP treated: Yes     HDL Cholesterol: 65.5 mg/dL     Total Cholesterol: 294 mg/dL  I reviewed the patients updated PMH, FH, and SocHx.    Patient Active Problem List   Diagnosis Date Noted   High grade  squamous intraepithelial lesion (HGSIL), grade 2 CIN, on biopsy of cervix 08/06/2023    Priority: High   History of loop electrical excision procedure (LEEP) 08/06/2023    Priority: High   Lumbar radiculopathy 06/22/2022    Priority: High   Family history of brain cancer 09/17/2017    Priority: High   Family history of brain stem cancer 09/17/2017    Priority: High   Vasomotor symptoms due to menopause 08/06/2023    Priority: Medium    Degenerative disc disease, lumbar 09/15/2022    Priority: Medium    Foraminal stenosis of lumbar region 09/15/2022    Priority: Medium    Cervical high risk HPV (human papillomavirus) test positive 08/02/2021    Priority: Medium    Adjustment disorder with anxiety 01/17/2021    Priority: Medium    Low back pain, episodic 01/17/2021    Priority: Medium    IBS (irritable bowel syndrome) 03/10/2013    Priority: Medium    Primary stress urinary incontinence 08/06/2023    Priority: Low   Snoring 12/30/2021    Priority: Low   Allergic rhinitis due to animal hair and dander 07/26/2021    Priority: Low   Allergic rhinitis due to pollen 07/26/2021    Priority: Low   Chronic allergic conjunctivitis 07/26/2021    Priority: Low   IUD (intrauterine device) in place - mirena 2018 09/17/2017    Priority: Low   Mixed hyperlipidemia 09/11/2023   Overweight (BMI 25.0-29.9) 09/11/2023    Allergies: Patient has no known allergies.  Social History: Patient  reports that she quit smoking about 12 years ago. Her smoking use included cigarettes. She started smoking about 19 years ago. She has a 0.7 pack-year smoking history. She has never been exposed to tobacco smoke. She has never used smokeless tobacco. She reports current alcohol use of about 3.0 standard drinks of alcohol per week. She reports that she does not use drugs.  Current Meds  Medication Sig   azelastine (ASTELIN) 0.1 % nasal spray Place 1 spray into both nostrils 2 (two) times daily.    clonazePAM  (KLONOPIN ) 0.5 MG tablet Take 1 tablet (0.5 mg total) by mouth daily as needed for anxiety.   diclofenac  (VOLTAREN ) 75 MG EC tablet Take 1 tablet (75 mg total) by mouth daily as needed.   dicyclomine  (BENTYL ) 20 MG tablet Take 1 tablet (20 mg total) by mouth 3 (three) times daily as needed for spasms.   DULoxetine (CYMBALTA) 30 MG capsule Take by mouth.   fluticasone (FLONASE SENSIMIST) 27.5 MCG/SPRAY nasal spray Place 1 spray into the nose daily as needed for allergies.   hydrocortisone  (ANUSOL -HC)  25 MG suppository Place 1 suppository (25 mg total) rectally 2 (two) times daily.   levocetirizine (XYZAL) 5 MG tablet Take 5 mg by mouth every evening.   levonorgestrel (MIRENA) 20 MCG/24HR IUD 1 each by Intrauterine route once.   Olopatadine HCl (PATADAY) 0.2 % SOLN 1 drop into affected eye Ophthalmic Once  a day   ondansetron  (ZOFRAN ) 4 MG tablet Take 1 tablet (4 mg total) by mouth every 8 (eight) hours as needed for nausea or vomiting.   rosuvastatin  (CRESTOR ) 5 MG tablet Take 1 tablet (5 mg total) by mouth at bedtime.   valsartan-hydrochlorothiazide (DIOVAN-HCT) 80-12.5 MG tablet Take 1 tablet by mouth daily.   zolpidem  (AMBIEN ) 10 MG tablet Take 0.5-1 tablets (5-10 mg total) by mouth at bedtime as needed for sleep.   [DISCONTINUED] tirzepatide  (ZEPBOUND ) 2.5 MG/0.5ML injection vial Inject 2.5 mg into the skin once a week.    Review of Systems: Cardiovascular: negative for chest pain, palpitations, leg swelling, orthopnea Respiratory: negative for SOB, wheezing or persistent cough Gastrointestinal: negative for abdominal pain Genitourinary: negative for dysuria or gross hematuria  Objective  Vitals: BP (!) 144/86   Pulse 88   Temp 97.9 F (36.6 C)   Ht 5' 1 (1.549 m)   Wt 148 lb 12.8 oz (67.5 kg)   SpO2 98%   BMI 28.12 kg/m  General: no acute distress  Psych:  Alert and oriented, normal mood and affect HEENT:  Normocephalic, atraumatic, supple neck  Cardiovascular:  RRR  without murmur. no edema Respiratory:  Good breath sounds bilaterally, CTAB with normal respiratory effort Skin:  Warm, no rashes Neurologic:   Mental status is normal Commons side effects, risks, benefits, and alternatives for medications and treatment plan prescribed today were discussed, and the patient expressed understanding of the given instructions. Patient is instructed to call or message via MyChart if he/she has any questions or concerns regarding our treatment plan. No barriers to understanding were identified. We discussed Red Flag symptoms and signs in detail. Patient expressed understanding regarding what to do in case of urgent or emergency type symptoms.  Medication list was reconciled, printed and provided to the patient in AVS. Patient instructions and summary information was reviewed with the patient as documented in the AVS. This note was prepared with assistance of Dragon voice recognition software. Occasional wrong-word or sound-a-like substitutions may have occurred due to the inherent limitation

## 2023-11-12 ENCOUNTER — Ambulatory Visit: Admitting: Family Medicine

## 2023-11-19 ENCOUNTER — Ambulatory Visit: Admitting: Family Medicine

## 2023-11-19 ENCOUNTER — Encounter: Payer: Self-pay | Admitting: Family Medicine

## 2023-11-19 VITALS — BP 126/84 | HR 97 | Temp 97.8°F | Ht 61.0 in | Wt 149.6 lb

## 2023-11-19 DIAGNOSIS — E663 Overweight: Secondary | ICD-10-CM | POA: Diagnosis not present

## 2023-11-19 DIAGNOSIS — F4322 Adjustment disorder with anxiety: Secondary | ICD-10-CM

## 2023-11-19 DIAGNOSIS — F410 Panic disorder [episodic paroxysmal anxiety] without agoraphobia: Secondary | ICD-10-CM | POA: Diagnosis not present

## 2023-11-19 DIAGNOSIS — F430 Acute stress reaction: Secondary | ICD-10-CM

## 2023-11-19 DIAGNOSIS — I10 Essential (primary) hypertension: Secondary | ICD-10-CM | POA: Diagnosis not present

## 2023-11-19 LAB — BASIC METABOLIC PANEL WITH GFR
BUN: 12 mg/dL (ref 6–23)
CO2: 26 meq/L (ref 19–32)
Calcium: 9.3 mg/dL (ref 8.4–10.5)
Chloride: 102 meq/L (ref 96–112)
Creatinine, Ser: 0.62 mg/dL (ref 0.40–1.20)
GFR: 106.34 mL/min (ref >60.00)
Glucose, Bld: 116 mg/dL — ABNORMAL HIGH (ref 70–99)
Potassium: 4 meq/L (ref 3.5–5.1)
Sodium: 138 meq/L (ref 135–145)

## 2023-11-19 MED ORDER — CLONAZEPAM 0.5 MG PO TABS: 0.5000 mg | ORAL_TABLET | Freq: Every day | ORAL | 0 refills | Status: AC | PRN

## 2023-11-19 NOTE — Progress Notes (Signed)
 Subjective  CC:  Chief Complaint  Patient presents with   Hypertension   Anxiety    HPI: Alison Tucker is a 48 y.o. female who presents to the office today to address the problems listed above in the chief complaint. Discussed the use of AI scribe software for clinical note transcription with the patient, who gave verbal consent to proceed.  History of Present Illness Alison Tucker is a 48 year old female with hypertension who presents with fluctuating blood pressure readings and anxiety.  She has been experiencing fluctuating blood pressure readings despite using a home blood pressure monitor. Her readings vary significantly, with a recent reading of 139/88 mmHg at home compared to 126/84 mmHg at the clinic. Morning readings have been between 116 and 119 mmHg over the past two days. She is unsure if she is using the monitor correctly, although she was shown how to use it.  She is experiencing anxiety, which she attributes to stress at work. Symptoms include difficulty breathing and a high heart rate during episodes. She associates the onset of her anxiety symptoms with changes in her workplace environment, particularly related to her supervisor's presence. These symptoms have been ongoing for approximately a week and a half. She has previously been prescribed Klonopin  for anxiety, which she found effective.  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   Assessment  1. Essential hypertension   2. Overweight (BMI 25.0-29.9)   3. Panic attack as reaction to stress   4. Adjustment disorder with anxiety      Plan  Assessment and Plan Assessment & Plan Hypertension Blood pressure much improved with current medication. - Continue current antihypertensive medications. Valsartan  hct. - Order CBC and kidney function tests to monitor potassium levels  and renal function.  Anxiety disorder Increased anxiety symptoms likely due to work-related stress, with dyspnea and tachycardia indicating panic attacks. - Prescribed Klonopin  for acute anxiety symptoms. Prn use, 0.5mg     Follow up: 6 mo for recheck Orders Placed This Encounter  Procedures   Basic metabolic panel with GFR   Meds ordered this encounter  Medications   clonazePAM  (KLONOPIN ) 0.5 MG tablet    Sig: Take 1 tablet (0.5 mg total) by mouth daily as needed for anxiety.    Dispense:  20 tablet    Refill:  0     I reviewed the patients updated PMH, FH, and SocHx.  Patient Active Problem List   Diagnosis Date Noted   High grade squamous intraepithelial lesion (HGSIL), grade 2 CIN, on biopsy of cervix 08/06/2023    Priority: High   History of loop electrical excision procedure (LEEP) 08/06/2023    Priority: High   Lumbar radiculopathy 06/22/2022    Priority: High   Family history of brain cancer 09/17/2017    Priority: High   Family history of brain stem cancer 09/17/2017    Priority: High   Vasomotor symptoms due to menopause 08/06/2023    Priority: Medium    Degenerative disc disease, lumbar 09/15/2022    Priority: Medium    Foraminal stenosis of lumbar region 09/15/2022    Priority: Medium    Cervical high risk HPV (human papillomavirus) test positive 08/02/2021    Priority: Medium    Adjustment disorder with anxiety 01/17/2021    Priority: Medium    Low back pain, episodic 01/17/2021    Priority: Medium  IBS (irritable bowel syndrome) 03/10/2013    Priority: Medium    Primary stress urinary incontinence 08/06/2023    Priority: Low   Snoring 12/30/2021    Priority: Low   Allergic rhinitis due to animal hair and dander 07/26/2021    Priority: Low   Allergic rhinitis due to pollen 07/26/2021    Priority: Low   Chronic allergic conjunctivitis 07/26/2021    Priority: Low   IUD (intrauterine device) in place - mirena 2018 09/17/2017    Priority: Low    Essential hypertension 11/19/2023   Mixed hyperlipidemia 09/11/2023   Overweight (BMI 25.0-29.9) 09/11/2023   Current Meds  Medication Sig   azelastine (ASTELIN) 0.1 % nasal spray Place 1 spray into both nostrils 2 (two) times daily.   diclofenac  (VOLTAREN ) 75 MG EC tablet Take 1 tablet (75 mg total) by mouth daily as needed.   dicyclomine  (BENTYL ) 20 MG tablet Take 1 tablet (20 mg total) by mouth 3 (three) times daily as needed for spasms.   DULoxetine (CYMBALTA) 30 MG capsule Take by mouth.   fluticasone (FLONASE SENSIMIST) 27.5 MCG/SPRAY nasal spray Place 1 spray into the nose daily as needed for allergies.   hydrocortisone  (ANUSOL -HC) 25 MG suppository Place 1 suppository (25 mg total) rectally 2 (two) times daily.   levocetirizine (XYZAL) 5 MG tablet Take 5 mg by mouth every evening.   levonorgestrel (MIRENA) 20 MCG/24HR IUD 1 each by Intrauterine route once.   Olopatadine HCl (PATADAY) 0.2 % SOLN 1 drop into affected eye Ophthalmic Once  a day   ondansetron  (ZOFRAN ) 4 MG tablet Take 1 tablet (4 mg total) by mouth every 8 (eight) hours as needed for nausea or vomiting.   rosuvastatin  (CRESTOR ) 5 MG tablet Take 1 tablet (5 mg total) by mouth at bedtime.   valsartan -hydrochlorothiazide  (DIOVAN -HCT) 80-12.5 MG tablet Take 1 tablet by mouth daily.   zolpidem  (AMBIEN ) 10 MG tablet Take 0.5-1 tablets (5-10 mg total) by mouth at bedtime as needed for sleep.   [DISCONTINUED] clonazePAM  (KLONOPIN ) 0.5 MG tablet Take 1 tablet (0.5 mg total) by mouth daily as needed for anxiety.   Allergies: Patient has no known allergies. Family History: Patient family history includes Alcohol abuse in her maternal grandfather; Arthritis in her maternal grandmother and mother; Brain cancer in an other family member; Brain cancer (age of onset: 64) in her son; Colon polyps in her father; Hyperlipidemia in her mother; Hypertension in her mother; Learning disabilities in her daughter; Mental retardation in her  daughter; Seizures in her daughter; Stroke in her mother. Social History:  Patient  reports that she quit smoking about 12 years ago. Her smoking use included cigarettes. She started smoking about 19 years ago. She has a 0.7 pack-year smoking history. She has never been exposed to tobacco smoke. She has never used smokeless tobacco. She reports current alcohol use of about 3.0 standard drinks of alcohol per week. She reports that she does not use drugs.  Review of Systems: Constitutional: Negative for fever malaise or anorexia Cardiovascular: negative for chest pain Respiratory: negative for SOB or persistent cough Gastrointestinal: negative for abdominal pain  Objective  Vitals: BP 126/84   Pulse 97   Temp 97.8 F (36.6 C)   Ht 5' 1 (1.549 m)   Wt 149 lb 9.6 oz (67.9 kg)   SpO2 99%   BMI 28.27 kg/m  General: no acute distress , A&Ox3 Psych: good insight, nl affect Commons side effects, risks, benefits, and alternatives for medications and treatment plan  prescribed today were discussed, and the patient expressed understanding of the given instructions. Patient is instructed to call or message via MyChart if he/she has any questions or concerns regarding our treatment plan. No barriers to understanding were identified. We discussed Red Flag symptoms and signs in detail. Patient expressed understanding regarding what to do in case of urgent or emergency type symptoms.  Medication list was reconciled, printed and provided to the patient in AVS. Patient instructions and summary information was reviewed with the patient as documented in the AVS. This note was prepared with assistance of Dragon voice recognition software. Occasional wrong-word or sound-a-like substitutions may have occurred due to the inherent limitations of voice recognition software

## 2023-11-20 ENCOUNTER — Ambulatory Visit: Admitting: Family Medicine

## 2023-11-22 ENCOUNTER — Ambulatory Visit: Payer: Self-pay | Admitting: Family Medicine

## 2023-11-22 NOTE — Progress Notes (Signed)
 See mychart note

## 2023-12-24 ENCOUNTER — Encounter: Payer: Self-pay | Admitting: Family Medicine

## 2024-08-11 ENCOUNTER — Encounter: Admitting: Family Medicine
